# Patient Record
Sex: Male | Born: 1970 | Race: Asian | Hispanic: No | Marital: Married | Smoking: Never smoker
Health system: Southern US, Community
[De-identification: ages and names within clinical notes are randomized; demographics above are authoritative.]

## PROBLEM LIST (undated history)

## (undated) DIAGNOSIS — R040 Epistaxis: Secondary | ICD-10-CM

## (undated) DIAGNOSIS — J329 Chronic sinusitis, unspecified: Secondary | ICD-10-CM

## (undated) DIAGNOSIS — Z91018 Allergy to other foods: Secondary | ICD-10-CM

## (undated) DIAGNOSIS — J302 Other seasonal allergic rhinitis: Secondary | ICD-10-CM

## (undated) DIAGNOSIS — S060XAA Concussion with loss of consciousness status unknown, initial encounter: Secondary | ICD-10-CM

## (undated) DIAGNOSIS — E785 Hyperlipidemia, unspecified: Secondary | ICD-10-CM

## (undated) DIAGNOSIS — Z86010 Personal history of colonic polyps: Secondary | ICD-10-CM

## (undated) DIAGNOSIS — D1803 Hemangioma of intra-abdominal structures: Secondary | ICD-10-CM

## (undated) DIAGNOSIS — S060X9A Concussion with loss of consciousness of unspecified duration, initial encounter: Secondary | ICD-10-CM

## (undated) DIAGNOSIS — R51 Headache: Secondary | ICD-10-CM

## (undated) DIAGNOSIS — K219 Gastro-esophageal reflux disease without esophagitis: Secondary | ICD-10-CM

## (undated) DIAGNOSIS — L509 Urticaria, unspecified: Secondary | ICD-10-CM

## (undated) HISTORY — DX: Chronic sinusitis, unspecified: J32.9

## (undated) HISTORY — DX: Epistaxis: R04.0

## (undated) HISTORY — DX: Urticaria, unspecified: L50.9

## (undated) HISTORY — DX: Hyperlipidemia, unspecified: E78.5

## (undated) HISTORY — DX: Other seasonal allergic rhinitis: J30.2

## (undated) HISTORY — DX: Concussion with loss of consciousness of unspecified duration, initial encounter: S06.0X9A

## (undated) HISTORY — DX: Gastro-esophageal reflux disease without esophagitis: K21.9

## (undated) HISTORY — DX: Headache: R51

## (undated) HISTORY — DX: Concussion with loss of consciousness status unknown, initial encounter: S06.0XAA

---
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
## 1992-10-18 HISTORY — PX: NASAL SINUS SURGERY: SHX719

## 2006-03-01 ENCOUNTER — Ambulatory Visit: Payer: Self-pay | Admitting: Internal Medicine

## 2006-03-10 ENCOUNTER — Ambulatory Visit: Payer: Self-pay | Admitting: Internal Medicine

## 2006-03-16 ENCOUNTER — Ambulatory Visit: Payer: Self-pay | Admitting: Internal Medicine

## 2006-07-10 ENCOUNTER — Emergency Department: Payer: Self-pay | Admitting: Emergency Medicine

## 2006-07-10 ENCOUNTER — Other Ambulatory Visit: Payer: Self-pay

## 2008-02-02 ENCOUNTER — Ambulatory Visit: Payer: Self-pay | Admitting: Internal Medicine

## 2008-02-02 LAB — CONVERTED CEMR LAB
Basophils Absolute: 0 10*3/uL (ref 0.0–0.1)
Bilirubin Urine: NEGATIVE
Bilirubin, Direct: 0.1 mg/dL (ref 0.0–0.3)
Calcium: 9.1 mg/dL (ref 8.4–10.5)
Creatinine, Ser: 0.8 mg/dL (ref 0.4–1.5)
Eosinophils Absolute: 0.1 10*3/uL (ref 0.0–0.7)
GFR calc Af Amer: 140 mL/min
GFR calc non Af Amer: 116 mL/min
HDL: 37.4 mg/dL — ABNORMAL LOW (ref >39.0)
Hemoglobin: 15.7 g/dL (ref 13.0–17.0)
Ketones, ur: NEGATIVE mg/dL
LDL Cholesterol: 120 mg/dL — ABNORMAL HIGH (ref 0–99)
Leukocytes, UA: NEGATIVE
Lymphocytes Relative: 33.8 % (ref 12.0–46.0)
MCHC: 35.2 g/dL (ref 30.0–36.0)
MCV: 86.3 fL (ref 78.0–100.0)
Monocytes Absolute: 0.4 10*3/uL (ref 0.1–1.0)
Neutro Abs: 2.9 10*3/uL (ref 1.4–7.7)
RDW: 13.7 % (ref 11.5–14.6)
Sodium: 142 meq/L (ref 135–145)
TSH: 0.79 microintl units/mL (ref 0.35–5.50)
Total Bilirubin: 0.9 mg/dL (ref 0.3–1.2)
Total CHOL/HDL Ratio: 4.8
Triglycerides: 110 mg/dL (ref 0–149)
Urine Glucose: NEGATIVE mg/dL
Urobilinogen, UA: 0.2 (ref 0.0–1.0)
pH: 5.5 (ref 5.0–8.0)

## 2008-02-12 ENCOUNTER — Ambulatory Visit: Payer: Self-pay | Admitting: Internal Medicine

## 2008-02-12 DIAGNOSIS — J309 Allergic rhinitis, unspecified: Secondary | ICD-10-CM | POA: Insufficient documentation

## 2009-04-21 ENCOUNTER — Emergency Department: Payer: Self-pay

## 2009-04-22 ENCOUNTER — Ambulatory Visit: Payer: Self-pay | Admitting: Internal Medicine

## 2009-04-22 DIAGNOSIS — H6690 Otitis media, unspecified, unspecified ear: Secondary | ICD-10-CM | POA: Insufficient documentation

## 2009-04-22 DIAGNOSIS — R51 Headache: Secondary | ICD-10-CM

## 2009-04-22 DIAGNOSIS — R519 Headache, unspecified: Secondary | ICD-10-CM | POA: Insufficient documentation

## 2010-06-12 ENCOUNTER — Encounter (INDEPENDENT_AMBULATORY_CARE_PROVIDER_SITE_OTHER): Payer: Self-pay | Admitting: *Deleted

## 2010-06-12 ENCOUNTER — Ambulatory Visit: Payer: Self-pay | Admitting: Internal Medicine

## 2010-06-12 DIAGNOSIS — R040 Epistaxis: Secondary | ICD-10-CM

## 2010-06-12 LAB — CONVERTED CEMR LAB
Basophils Relative: 0.3 % (ref 0.0–3.0)
Eosinophils Relative: 1.6 % (ref 0.0–5.0)
HCT: 45.4 % (ref 39.0–52.0)
Lymphs Abs: 2.3 10*3/uL (ref 0.7–4.0)
MCV: 87.6 fL (ref 78.0–100.0)
Monocytes Absolute: 0.5 10*3/uL (ref 0.1–1.0)
Monocytes Relative: 8.1 % (ref 3.0–12.0)
RBC: 5.19 M/uL (ref 4.22–5.81)
WBC: 6.3 10*3/uL (ref 4.5–10.5)

## 2010-06-25 ENCOUNTER — Encounter: Payer: Self-pay | Admitting: Internal Medicine

## 2010-07-10 ENCOUNTER — Ambulatory Visit: Payer: Self-pay | Admitting: Internal Medicine

## 2010-07-10 ENCOUNTER — Encounter: Payer: Self-pay | Admitting: Internal Medicine

## 2010-07-17 ENCOUNTER — Ambulatory Visit: Payer: Self-pay | Admitting: Internal Medicine

## 2010-07-20 LAB — CONVERTED CEMR LAB
BUN: 18 mg/dL (ref 6–23)
Chloride: 103 meq/L (ref 96–112)
Cholesterol: 200 mg/dL (ref 0–200)
Creatinine, Ser: 0.8 mg/dL (ref 0.4–1.5)
GFR calc non Af Amer: 107.7 mL/min (ref >60)
LDL Cholesterol: 139 mg/dL — ABNORMAL HIGH (ref 0–99)
Potassium: 4.1 meq/L (ref 3.5–5.1)
Total CHOL/HDL Ratio: 6
Triglycerides: 129 mg/dL (ref 0.0–149.0)
VLDL: 25.8 mg/dL (ref 0.0–40.0)

## 2010-07-31 ENCOUNTER — Ambulatory Visit: Payer: Self-pay | Admitting: Internal Medicine

## 2010-08-18 ENCOUNTER — Encounter (INDEPENDENT_AMBULATORY_CARE_PROVIDER_SITE_OTHER): Payer: Self-pay | Admitting: *Deleted

## 2010-11-17 NOTE — Letter (Signed)
Summary: [facility]  [facility]   Imported By: Phillis Knack 07/14/2010 14:45:00  _____________________________________________________________________  External Attachment:    Type:   Image     Comment:   External Document

## 2010-11-17 NOTE — Letter (Signed)
Summary: Out of Work  [employer] [place]   [place], [place] [address]   Phone: [phone]  Fax: [phone]    June 12, 2010   Employee:  RALIEGH SCOBIE    To Whom It May Concern:   For Medical reasons, please excuse the above named employee from work for the following dates:  Start: 06/11/2010    End:   06/12/2010    If you need additional information, please feel free to contact our office.         Sincerely,    Dr. Cathlean Cower

## 2010-11-17 NOTE — Letter (Signed)
Summary: Out of Work  [employer] [facility]  [address] [place], [place] [address]   Phone: [phone]  Fax: [phone]    August 18, 2010   Employee:  Douglas [name]    To Whom It May Concern:   For Medical reasons, please excuse the above named employee from work for the following dates:  Start:   September 30,2011  End:   September 30,2011  If you need additional information, please feel free to contact our office.         Sincerely,     Maudie Mercury Dance CMA ([place])

## 2010-11-17 NOTE — Letter (Signed)
Summary: Out of Work  [employer] [facility]  [address] [place], [place] [address]   Phone: [phone]  Fax: [phone]    August 18, 2010   Employee:  Douglas [name]    To Whom It May Concern:   For Medical reasons, please excuse the above named employee from work for the following dates:  Start:   July 10, 2010  End:   July 10, 2010  If you need additional information, please feel free to contact our office.         Sincerely,     Maudie Mercury Dance CMA ([place])

## 2010-11-17 NOTE — Consult Note (Signed)
Summary: [facility]  [facility]   Imported By: Bubba Hales 07/02/2010 10:25:14  _____________________________________________________________________  External Attachment:    Type:   Image     Comment:   External Document

## 2010-11-17 NOTE — Assessment & Plan Note (Signed)
Summary: NOSE BLEEDS/NWS  #   Vital Signs:  Patient profile:   40 year old Douglas [name] Height:      63 inches Weight:      125 pounds BMI:     22.22 O2 Sat:      97 % on Room air Temp:     97.5 degrees F oral Pulse rate:   51 / minute BP sitting:   100 / 62  (left arm) Cuff size:   regular  Vitals Entered By: Shirlean Mylar Ewing CMA (AAMA) (June 12, 2010 10:07 AM)  O2 Flow:  Room air CC: Nose bleeds for 2 weeks/Re   CC:  Nose bleeds for 2 weeks/Re.  History of Present Illness: here with 3 wks recurrent nosebleeds, large amount per wife an pt, may have started with scratching the nose as seemed painful to start, and bleeding several times intermitteint right side nose only;  first time occurred with blood to back of throat, next few times with blood from nose coming out;  no headache, sinus pain, fever, ST, cough adn Pt denies CP, worsening sob, doe, wheezing, orthopnea, pnd, worsening LE edema, palps, dizziness or syncope   Pt denies new neuro symptoms such as headache, facial or extremity weakness  No other overt bleeding or bruising.  Each bleeding episodes seemed to last about Douglas minutes.  Worried about anemia but no orthostasis or fatigue.  No prior hx of same.  No truama, and has not been seen per ER or urgent care.  Does have mils sinus allergies ongoing for several months not well or ? too well treated with the claritin OTC as it can be drying.  Has not tried afrin.  No prior tx with flonase , mucinex or similar or sudafed.   Problems Prior to Update: 1)  Nosebleed  (ICD-784.7) 2)  Headache  (ICD-784.0) 3)  Otitis Media, Acute  (ICD-382.9) 4)  Preventive Health Care  (ICD-V70.0) 5)  Allergic Rhinitis  (ICD-477.9)  Medications Prior to Update: 1)  Claritin 10 Mg  Tabs (Loratadine) .Marland Kitchen.. 1 By Mouth Prn 2)  Cephalexin 500 Mg Caps (Cephalexin) .Marland Kitchen.. 1 By Mouth Three Times A Day  Current Medications (verified): 1)  Singulair 10 Mg Tabs (Montelukast Sodium) .Marland Kitchen.. 1po Once Daily  Allergies  (verified): No Known Drug Allergies  Past History:  Past Medical History: Last updated: 02/12/2008 Allergic rhinitis chronic sinusitis  Past Surgical History: Last updated: 02/12/2008 sinus surgery  1994  Social History: Last updated: 02/12/2008 Married no children Never Smoked Alcohol use-no Visual merchandiser - english and math  Risk Factors: Smoking Status: never (02/12/2008)  Review of Systems       all otherwise negative per pt -    Physical Exam  General:  alert and well-developed.   Head:  normocephalic and atraumatic.   Eyes:  vision grossly intact, pupils equal, and pupils round.   Ears:  R ear normal and L ear normal.   Nose:  no external deformity, nasal dischargemucosal pallor, and mucosal edema.   Mouth:  pharyngeal erythema and fair dentition.   Neck:  supple and no masses.   Lungs:  no intercostal retractions and normal breath sounds.   Heart:  normal rate and regular rhythm.   Extremities:  no edema, no erythema  Neurologic:  cranial nerves grossly II-XII intact, and strength normal in all extremities.     Impression & Recommendations:  Problem # 1:  NOSEBLEED (ICD-784.7) recurrent right nares, prob post aspect bleed;  not bleeding now, to  check cbc, to refer ent for further eval and tx Orders: ENT Referral (ENT) TLB-CBC Platelet - w/Differential ([id number])  Problem # 2:  ALLERGIC RHINITIS (ICD-477.9)  change to singulair as claritin may be too drying, and other antihist likely to do the same, and avoid flonase and similar due to possible worsening noebleed side effect  Problem # 3:  HEADACHE (ICD-784.0) improved from previuos, ok to follow for now  Complete Medication List: 1)  Singulair 10 Mg Tabs (Montelukast sodium) .Marland Kitchen.. 1po once daily  Patient Instructions: 1)  Please take all new medications as prescribed 2)  Continue all previous medications as before this visit  3)  Please go to the Lab in the basement for your blood and/or urine  tests today 4)  Please call the number on the [facility] for results of your testing 5)  You will be contacted about the referral(s) to: ENT 6)  Please schedule a follow-up appointment in 6 months with CPX labs Prescriptions: SINGULAIR 10 MG TABS (MONTELUKAST SODIUM) 1po once daily  #30 x 5   Entered and Authorized by:   Biagio Borg MD   Signed by:   Biagio Borg MD on 06/12/2010   Method used:   Print then Give to Patient   RxID:   [id number]

## 2010-11-17 NOTE — Assessment & Plan Note (Signed)
Summary: PT TRANSFER FROM DR JOHN/CPX/CLE   Vital Signs:  Patient profile:   40 year old male Weight:      125.75 pounds Temp:     98.4 degrees F oral Pulse rate:   64 / minute Pulse rhythm:   regular BP sitting:   124 / 76  (left arm) Cuff size:   regular  Vitals Entered By: Maudie Mercury Dance CMA Deborra Medina) (July 10, 2010 10:24 AM) CC: Transfer from Dr. Jenny Reichmann   History of Present Illness: CC: transfer from Dr. Jenny Reichmann requests CPE.  1. ?allergies - during fall feels lightheaded (no dizziness, vertigo).  Also with slight tenderness behind eyes.  only happens during fall.  Takes claritin for this which helps.  Had nosebleed last week.  thinks nasal saline more drying.  To see ENT in 2 wks for f/u after cauterization.  thinks claritin causes eye dryness, but improves nasal rhinorrhea/congestion.    2. CPE today.  unsure of last tetanus shot but pretty sure > 10 years.  no early fmhx CAD, prostate CA, colon CA.  last FLP with low HDL o/w normal, glu normal (2009).  Current Medications (verified): 1)  Singulair 10 Mg Tabs (Montelukast Sodium) .Marland Kitchen.. 1po Once Daily  Allergies (verified): No Known Drug Allergies  Past History:  Past medical, surgical, family and social histories (including risk factors) reviewed for relevance to current acute and chronic problems.  Past Medical History: Reviewed history from 02/12/2008 and no changes required. Allergic rhinitis chronic sinusitis  Past Surgical History: Reviewed history from 02/12/2008 and no changes required. sinus surgery  1994  Family History: Reviewed history from 02/12/2008 and no changes required. mother D with ESRD/dialysis father D with leukemia  No CA, CVA, CAD/MI, DM, HTN  Social History: Reviewed history from 02/12/2008 and no changes required. Married 41yo adopted Never Smoked Alcohol use-no Visual merchandiser - english and math  Review of Systems       The patient complains of headaches.  The patient denies anorexia,  fever, weight loss, weight gain, vision loss, decreased hearing, hoarseness, chest pain, syncope, dyspnea on exertion, peripheral edema, prolonged cough, abdominal pain, melena, hematochezia, severe indigestion/heartburn, hematuria, genital sores, muscle weakness, suspicious skin lesions, transient blindness, difficulty walking, depression, and testicular masses.         per HPI  Physical Exam  General:  alert and well-developed.   Head:  normocephalic and atraumatic.   Eyes:  vision grossly intact, pupils equal, and pupils round.  early pterygium  Ears:  R ear normal and L ear normal.   Nose:  mucosal pallor, edema. Mouth:  fair dentition, MMM, no pharyngeal erythema/edema Neck:  supple and no masses.   Lungs:  no intercostal retractions and normal breath sounds.   Heart:  normal rate and regular rhythm.   Abdomen:  Bowel sounds positive,abdomen soft and non-tender without masses, organomegaly or hernias noted. Msk:  No deformity or scoliosis noted of thoracic or lumbar spine.   Pulses:  2+ rad pulses Extremities:  no edema Neurologic:  CN grossly intact, station and gait intact Skin:  Intact without suspicious lesions or rashes   Impression & Recommendations:  Problem # 1:  PREVENTIVE HEALTH CARE (ICD-V70.0) Reviewed preventive care protocols, scheduled due services, and updated immunizations.  tdap today.  to receive flu at work.  Problem # 2:  ALLERGIC RHINITIS (ICD-477.9) Discussed use of allergy medications and environmental measures.  pt thinks may have had intolerance to singulair but unsure.  provided with samples today to try.  consider trial of astelin vs other antihistamine if claritin too drying.  Complete Medication List: 1)  Singulair 10 Mg Tabs (Montelukast sodium) .Marland Kitchen.. 1po once daily  Patient Instructions: 1)  Flu shot at work.  Tetanus shot today. 2)  Pleasure to meet you today, call clinic with questions. 3)  Return next week fasting for blood work [FLP, BMP  V70.0] 4)  Try singulair if continue to have HAs - sample provided.  Current Allergies (reviewed today): No known allergies    Prevention & Chronic Care Immunizations   Influenza vaccine: Not documented    Tetanus booster: 07/10/2010: Tdap   Tetanus booster due: 07/10/2020    Pneumococcal vaccine: Not documented  Other Screening   Smoking status: never  (02/12/2008)  Lipids   Total Cholesterol: 179  (02/02/2008)   LDL: 120  (02/02/2008)   LDL Direct: Not documented   HDL: 37.4  (02/02/2008)   Triglycerides: 110  (02/02/2008)   Immunizations Administered:  Tetanus Vaccine:    Vaccine Type: Tdap    Site: left deltoid    Mfr: GlaxoSmithKline    Dose: 0.5 ml    Route: IM    Given by: Maudie Mercury Dance CMA ([place])    Exp. Date: 07/08/2012    Lot #: [id number]    VIS given: 09/04/08 version given July 10, 2010.

## 2011-07-09 ENCOUNTER — Other Ambulatory Visit (INDEPENDENT_AMBULATORY_CARE_PROVIDER_SITE_OTHER): Payer: Self-pay

## 2011-07-09 ENCOUNTER — Other Ambulatory Visit: Payer: Self-pay | Admitting: Family Medicine

## 2011-07-09 ENCOUNTER — Encounter: Payer: Self-pay | Admitting: Family Medicine

## 2011-07-09 DIAGNOSIS — Z0000 Encounter for general adult medical examination without abnormal findings: Secondary | ICD-10-CM

## 2011-07-09 LAB — BASIC METABOLIC PANEL
BUN: 18 mg/dL (ref 6–23)
Chloride: 106 mEq/L (ref 96–112)
Creatinine, Ser: 0.8 mg/dL (ref 0.4–1.5)
Glucose, Bld: 92 mg/dL (ref 70–99)
Potassium: 4.8 mEq/L (ref 3.5–5.1)

## 2011-07-09 LAB — LIPID PANEL
Cholesterol: 195 mg/dL (ref 0–200)
LDL Cholesterol: 136 mg/dL — ABNORMAL HIGH (ref 0–99)
Triglycerides: 98 mg/dL (ref 0.0–149.0)

## 2011-07-12 ENCOUNTER — Ambulatory Visit (INDEPENDENT_AMBULATORY_CARE_PROVIDER_SITE_OTHER): Payer: BC Managed Care – PPO | Admitting: Family Medicine

## 2011-07-12 ENCOUNTER — Encounter: Payer: Self-pay | Admitting: Family Medicine

## 2011-07-12 DIAGNOSIS — Z0000 Encounter for general adult medical examination without abnormal findings: Secondary | ICD-10-CM | POA: Insufficient documentation

## 2011-07-12 DIAGNOSIS — R5381 Other malaise: Secondary | ICD-10-CM | POA: Insufficient documentation

## 2011-07-12 DIAGNOSIS — R5383 Other fatigue: Secondary | ICD-10-CM

## 2011-07-12 NOTE — Assessment & Plan Note (Addendum)
Reviewed preventative protocols and updated unless pt declined. Discussed heatlhy living and diet.

## 2011-07-12 NOTE — Progress Notes (Signed)
Subjective:    Patient ID: Douglas [name], male    DOB: May 17, 1971, 40 y.o.   MRN: [id number]  HPI CC: CPE  Here for CPE today.  Gets flu shot at work.  Not feeling too well - this morning started feeling ill.  Chills/fever, tired - took advil and felt better.  No abd pain, n/v, cough, RN or congestion, ST, HA.  + rash, thinks allergy rxn - present for long time, comes on after eating red meat.  No sick contacts at home.  Last weekend vomited x1, thinks something he ate.  Saturday 2d ago went hunting, didn't notice any tick bites but has had mosquito bites.    Doing well with claritin, singulair otherwise.  Allergies tend to do worse in fall than spring.  Medications and allergies reviewed and updated in chart.  Past histories reviewed and updated if relevant as below. Patient Active Problem List  Diagnoses  . OTITIS MEDIA, ACUTE  . ALLERGIC RHINITIS  . HEADACHE  . NOSEBLEED   Past Medical History  Diagnosis Date  . Allergic rhinitis   . Sinusitis, chronic   . Headache   . Epistaxis    Past Surgical History  Procedure Date  . Nasal sinus surgery 1994   History  Substance Use Topics  . Smoking status: Never Smoker   . Smokeless tobacco: Not on file  . Alcohol Use: No   Family History  Problem Relation Age of Onset  . Kidney disease Mother     ESRD-Dialysis  . Leukemia Father    No Known Allergies Current Outpatient Prescriptions on File Prior to Visit  Medication Sig Dispense Refill  . montelukast (SINGULAIR) 10 MG tablet Take 10 mg by mouth at bedtime.         Review of Systems  Constitutional: Negative for fever, chills, activity change, appetite change, fatigue and unexpected weight change.  HENT: Negative for hearing loss and neck pain.   Eyes: Negative for visual disturbance.  Respiratory: Negative for cough, chest tightness, shortness of breath and wheezing.   Cardiovascular: Negative for chest pain, palpitations and leg swelling.  Gastrointestinal: Negative  for nausea, vomiting, abdominal pain, diarrhea, constipation, blood in stool and abdominal distention.  Genitourinary: Negative for hematuria and difficulty urinating.  Musculoskeletal: Negative for myalgias and arthralgias.  Skin: Negative for rash.  Neurological: Negative for dizziness, seizures, syncope and headaches.  Hematological: Does not bruise/bleed easily.  Psychiatric/Behavioral: Negative for dysphoric mood. The patient is not nervous/anxious.        Objective:   Physical Exam  Nursing note and vitals reviewed. Constitutional: He is oriented to person, place, and time. He appears well-developed and well-nourished. No distress.  HENT:  Head: Normocephalic and atraumatic.  Right Ear: External ear normal.  Left Ear: External ear normal.  Nose: Nose normal.  Mouth/Throat: Oropharynx is clear and moist. No oropharyngeal exudate.  Eyes: Conjunctivae and EOM are normal. Pupils are equal, round, and reactive to light.  Neck: Normal range of motion. Neck supple. No thyromegaly present.  Cardiovascular: Normal rate, regular rhythm, normal heart sounds and intact distal pulses.   No murmur heard. Pulses:      Radial pulses are 2+ on the right side, and 2+ on the left side.  Pulmonary/Chest: Effort normal and breath sounds normal. No respiratory distress. He has no wheezes. He has no rales.  Abdominal: Soft. Bowel sounds are normal. He exhibits no distension and no mass. There is no tenderness. There is no rebound and no guarding.  Musculoskeletal: Normal range of motion. He exhibits no edema.  Lymphadenopathy:    He has no cervical adenopathy.       Right: No supraclavicular adenopathy present.       Left: No supraclavicular adenopathy present.  Neurological: He is alert and oriented to person, place, and time.       CN grossly intact, station and gait intact  Skin: Skin is warm and dry. No rash noted.  Psychiatric: He has a normal mood and affect. His behavior is normal. Judgment  and thought content normal.      Assessment & Plan:

## 2011-07-12 NOTE — Assessment & Plan Note (Signed)
Nonspecific sxs, 1d duration, recent blood work last week ok (BMP), exam today benign. Possible viral syndrome. Advised notify us if not improving as expected, would likely obtain more blood work ,starting with CBC.

## 2011-07-12 NOTE — Patient Instructions (Signed)
This could be viral infection. Let me know if fever >101.5, bad headache or abdominal pain or confusion, we may want to see you again. If not improving as expected, let us know. Otherwise things are looking good today.  Return in 1-2 years for next physical.

## 2011-07-15 ENCOUNTER — Telehealth: Payer: Self-pay | Admitting: *Deleted

## 2011-07-15 NOTE — Telephone Encounter (Signed)
Filled and placed in my outbox.

## 2011-07-15 NOTE — Telephone Encounter (Signed)
Pt has dropped off a form for her insurance.  This just needs your signature, in your in box.

## 2011-07-16 NOTE — Telephone Encounter (Signed)
OUT box handled by front office.

## 2011-09-17 LAB — COMPREHENSIVE METABOLIC PANEL
Calcium: 9 mg/dL
Chloride: 102 mmol/L
Creat: 0.9
Potassium: 3.6 mmol/L
Sodium: 140 mmol/L (ref 137–147)

## 2011-09-17 LAB — LIPID PANEL: HDL: 39 mg/dL (ref 35–70)

## 2011-09-29 ENCOUNTER — Encounter: Payer: Self-pay | Admitting: Family Medicine

## 2012-07-02 ENCOUNTER — Other Ambulatory Visit: Payer: Self-pay | Admitting: Family Medicine

## 2012-07-02 DIAGNOSIS — Z0000 Encounter for general adult medical examination without abnormal findings: Secondary | ICD-10-CM

## 2012-07-06 ENCOUNTER — Other Ambulatory Visit (INDEPENDENT_AMBULATORY_CARE_PROVIDER_SITE_OTHER): Payer: BC Managed Care – PPO

## 2012-07-06 DIAGNOSIS — Z0000 Encounter for general adult medical examination without abnormal findings: Secondary | ICD-10-CM

## 2012-07-06 LAB — BASIC METABOLIC PANEL
BUN: 22 mg/dL (ref 6–23)
CO2: 28 mEq/L (ref 19–32)
Chloride: 105 mEq/L (ref 96–112)
Glucose, Bld: 94 mg/dL (ref 70–99)
Potassium: 3.9 mEq/L (ref 3.5–5.1)

## 2012-07-06 LAB — LIPID PANEL
LDL Cholesterol: 118 mg/dL — ABNORMAL HIGH (ref 0–99)
VLDL: 30.4 mg/dL (ref 0.0–40.0)

## 2012-07-13 ENCOUNTER — Encounter: Payer: Self-pay | Admitting: Family Medicine

## 2012-07-13 ENCOUNTER — Ambulatory Visit (INDEPENDENT_AMBULATORY_CARE_PROVIDER_SITE_OTHER): Payer: BC Managed Care – PPO | Admitting: Family Medicine

## 2012-07-13 VITALS — BP 112/70 | HR 59 | Temp 98.2°F | Wt 123.0 lb

## 2012-07-13 DIAGNOSIS — Z0000 Encounter for general adult medical examination without abnormal findings: Secondary | ICD-10-CM

## 2012-07-13 DIAGNOSIS — E785 Hyperlipidemia, unspecified: Secondary | ICD-10-CM

## 2012-07-13 NOTE — Progress Notes (Signed)
Subjective:    Patient ID: Douglas [name], male    DOB: 1970/11/16, 41 y.o.   MRN: [id number]  HPI CC: CPE  Preventative: Flu shot at work. Td - 2011  Caffeine: 1 cup coffee/day Married, lives with wife and son (adopted), no pets Occupation: Social research officer, government Activity: likes to play tennis with son and wife, walking at home Diet: fruits and vegetables daily, good water, red meat 1x/wk, fish 1x/wk  Sunscreen use discussed. Seatbelt use discussed.  Medications and allergies reviewed and updated in chart.  Past histories reviewed and updated if relevant as below. Patient Active Problem List  Diagnosis  . ALLERGIC RHINITIS  . HEADACHE  . Healthcare maintenance  . Malaise   Past Medical History  Diagnosis Date  . Seasonal allergic rhinitis     worse in spring and fall  . Sinusitis, chronic   . Headache   . Epistaxis    Past Surgical History  Procedure Date  . Nasal sinus surgery 1994   History  Substance Use Topics  . Smoking status: Never Smoker   . Smokeless tobacco: Never Used  . Alcohol Use: No   Family History  Problem Relation Age of Onset  . Kidney disease Mother     ESRD-Dialysis from too much tylenol  . Leukemia Father 60  . Diabetes Neg Hx   . Coronary artery disease Neg Hx   . Stroke Neg Hx    No Known Allergies Current Outpatient Prescriptions on File Prior to Visit  Medication Sig Dispense Refill  . loratadine (CLARITIN) 10 MG tablet Take 10 mg by mouth daily.          Review of Systems  Constitutional: Negative for fever, chills, activity change, appetite change, fatigue and unexpected weight change.  HENT: Negative for hearing loss and neck pain.   Eyes: Negative for visual disturbance.  Respiratory: Negative for cough, chest tightness, shortness of breath and wheezing.   Cardiovascular: Negative for chest pain, palpitations and leg swelling.  Gastrointestinal: Negative for nausea, vomiting, abdominal pain, diarrhea, constipation, blood in stool and  abdominal distention.  Genitourinary: Negative for hematuria and difficulty urinating.  Musculoskeletal: Negative for myalgias and arthralgias.  Skin: Negative for rash.  Neurological: Negative for dizziness, seizures, syncope and headaches.  Hematological: Does not bruise/bleed easily.  Psychiatric/Behavioral: Negative for dysphoric mood. The patient is not nervous/anxious.        Objective:   Physical Exam  Nursing note and vitals reviewed. Constitutional: He is oriented to person, place, and time. He appears well-developed and well-nourished. No distress.  HENT:  Head: Normocephalic and atraumatic.  Right Ear: Hearing, tympanic membrane, external ear and ear canal normal.  Left Ear: Hearing, tympanic membrane, external ear and ear canal normal.  Nose: Nose normal.  Mouth/Throat: Oropharynx is clear and moist. No oropharyngeal exudate.  Eyes: Conjunctivae normal and EOM are normal. Pupils are equal, round, and reactive to light. No scleral icterus.  Neck: Normal range of motion. Neck supple.  Cardiovascular: Normal rate, regular rhythm, normal heart sounds and intact distal pulses.   No murmur heard. Pulses:      Radial pulses are 2+ on the right side, and 2+ on the left side.  Pulmonary/Chest: Effort normal and breath sounds normal. No respiratory distress. He has no wheezes. He has no rales.  Abdominal: Soft. Bowel sounds are normal. He exhibits no distension and no mass. There is no tenderness. There is no rebound and no guarding.  Musculoskeletal: Normal range of motion. He exhibits no  edema.  Lymphadenopathy:    He has no cervical adenopathy.  Neurological: He is alert and oriented to person, place, and time.       CN grossly intact, station and gait intact  Skin: Skin is warm and dry. No rash noted.  Psychiatric: He has a normal mood and affect. His behavior is normal. Judgment and thought content normal.       Assessment & Plan:

## 2012-07-13 NOTE — Patient Instructions (Signed)
Good to see you today, call us with questions. Return in 1-2 years for next physical. Work on more aerobic exercise.

## 2012-07-13 NOTE — Assessment & Plan Note (Signed)
Preventative protocols reviewed and updated unless pt declined. Discussed healthy diet and lifestyle.  Encouraged increased aerobic exercise to improve HDL

## 2012-07-13 NOTE — Assessment & Plan Note (Signed)
Reviewed #s with patient.

## 2013-11-14 ENCOUNTER — Ambulatory Visit (INDEPENDENT_AMBULATORY_CARE_PROVIDER_SITE_OTHER): Payer: BC Managed Care – PPO | Admitting: Internal Medicine

## 2013-11-14 ENCOUNTER — Encounter: Payer: Self-pay | Admitting: Internal Medicine

## 2013-11-14 VITALS — BP 102/70 | HR 64 | Temp 98.2°F | Wt 126.5 lb

## 2013-11-14 DIAGNOSIS — R109 Unspecified abdominal pain: Secondary | ICD-10-CM

## 2013-11-14 DIAGNOSIS — B349 Viral infection, unspecified: Secondary | ICD-10-CM

## 2013-11-14 DIAGNOSIS — B9789 Other viral agents as the cause of diseases classified elsewhere: Secondary | ICD-10-CM

## 2013-11-14 DIAGNOSIS — R112 Nausea with vomiting, unspecified: Secondary | ICD-10-CM

## 2013-11-14 DIAGNOSIS — Z20828 Contact with and (suspected) exposure to other viral communicable diseases: Secondary | ICD-10-CM

## 2013-11-14 DIAGNOSIS — R509 Fever, unspecified: Secondary | ICD-10-CM

## 2013-11-14 LAB — POCT INFLUENZA A/B
Influenza A, POC: NEGATIVE
Influenza B, POC: NEGATIVE

## 2013-11-14 MED ORDER — ONDANSETRON HCL 4 MG PO TABS: 4.0000 mg | ORAL_TABLET | Freq: Three times a day (TID) | ORAL | Status: DC | PRN

## 2013-11-14 NOTE — Progress Notes (Signed)
HPI: Pt presents today with concerns about fatigue, emesis, and fever. Pt states he started feeling bad about two days ago with fever, chills, fatigue, emesis (worse in am), and abdominal cramping. Pt states he feels worse in am and returns in the evening. He denies cough, chest congestion, sore throat, nasal discharge, or shortness of breath. He endorses being around sick contacts. He has tried OTC Ibuprofen for fever.   Past Medical History  Diagnosis Date  . Seasonal allergic rhinitis     worse in spring and fall  . Sinusitis, chronic   . Headache(784.0)   . Epistaxis   . Dyslipidemia     Current Outpatient Prescriptions  Medication Sig Dispense Refill  . loratadine (CLARITIN) 10 MG tablet Take 10 mg by mouth daily.         No current facility-administered medications for this visit.    No Known Allergies  Family History  Problem Relation Age of Onset  . Kidney disease Mother     ESRD-Dialysis from too much tylenol  . Leukemia Father 51  . Diabetes Neg Hx   . Coronary artery disease Neg Hx   . Stroke Neg Hx     History   Social History  . Marital Status: Married    Spouse Name: N/A    Number of Children: 1  . Years of Education: N/A   Occupational History  . Student    Social History Main Topics  . Smoking status: Never Smoker   . Smokeless tobacco: Never Used  . Alcohol Use: No  . Drug Use: No  . Sexual Activity: Not on file   Other Topics Concern  . Not on file   Social History Narrative   Caffeine: 1 cup coffee/day   Married, lives with wife and son (adopted), no pets   Occupation: Social research officer, government   Activity: likes to play tennis with son and wife, walking at home   Diet: fruits and vegetables daily, good water, red meat 1x/wk, fish 1x/wk    ROS:  Constitutional:Endorses fever, chills, body aches.  Denies f, headache or abrupt weight changes.  HEENT:Endorses nasal discharge.  Denies eye pain, eye redness, ear pain, ringing in the ears, wax buildup,  nasal congestion, bloody nose, or sore throat. Respiratory: Denies difficulty breathing, shortness of breath, cough or sputum production.   Cardiovascular: Denies chest pain, chest tightness, palpitations or swelling in the hands or feet.  Gastrointestinal:Endorses abdominal cramping, nausea, emesis.  Denies abdominal pain, bloating, constipation, diarrhea or blood in the stool.   Neurological:Endorses dizziness. Denies difficulty with memory, difficulty with speech or problems with balance and coordination.   No other specific complaints in a complete review of systems (except as listed in HPI above).  PE:  BP 102/70  Pulse 64  Temp(Src) 98.2 F (36.8 C) (Oral)  Wt 126 lb 8 oz (57.38 kg)  SpO2 98% Wt Readings from Last 3 Encounters:  11/14/13 126 lb 8 oz (57.38 kg)  07/13/12 123 lb (55.792 kg)  07/12/11 126 lb 12 oz (57.493 kg)    General: Appears their stated age, well developed, well nourished in NAD. HEENT: Head: normal shape and size; Eyes: sclera white, no icterus, conjunctiva pink, PERRLA and EOMs intact; Ears: Tm's gray and intact, normal light reflex; Nose: mucosa pink and moist, septum midline; Throat/Mouth: Teeth present, mucosa pink and moist, no lesions or ulcerations noted.  Neck: Normal range of motion. Neck supple, trachea midline. No massses, lumps or thyromegaly present.  Cardiovascular: Normal rate and  rhythm. S1,S2 noted.  No murmur, rubs or gallops noted. No JVD or BLE edema. No carotid bruits noted. Pulmonary/Chest: Normal effort and positive vesicular breath sounds. No respiratory distress. No wheezes, rales or ronchi noted.  Abdomen: Soft and nontender. Hyperactive bowel sounds, no bruits noted.Non tender to palpation. Dull to resonant to percussion. No distention or masses noted. Liver, spleen and kidneys non palpable. Psychiatric: Mood and affect normal. Behavior is normal. Judgment and thought content normal.   Assessment and Plan: Gastritis,  viral Supportive care rest and fluids Rx sent for Zofran 40m po every 6 hrs as needed for nausea Follow up if in 3-5 days if symptoms worsen or do not improve  Moriah Loughry S, Student-NP

## 2013-11-14 NOTE — Progress Notes (Signed)
Pre-visit discussion using our clinic review tool. No additional management support is needed unless otherwise documented below in the visit note.  

## 2013-11-14 NOTE — Progress Notes (Signed)
Subjective:    Patient ID: Douglas [name], male    DOB: 1971/10/09, 43 y.o.   MRN: [id number]  HPI  Pt presents to the clinic today with c/o abdominal cramping, vomiting and fever. He reports this started 2 days ago. He has had associated chills, nausea. He has not been having any diarrhea. His symptoms seems to be worse in the morning and late in the evening. He has tried ibuprofen OTC. He has had sick contacts.  Review of Systems      Past Medical History  Diagnosis Date  . Seasonal allergic rhinitis     worse in spring and fall  . Sinusitis, chronic   . Headache(784.0)   . Epistaxis   . Dyslipidemia     Current Outpatient Prescriptions  Medication Sig Dispense Refill  . loratadine (CLARITIN) 10 MG tablet Take 10 mg by mouth daily.         No current facility-administered medications for this visit.    No Known Allergies  Family History  Problem Relation Age of Onset  . Kidney disease Mother     ESRD-Dialysis from too much tylenol  . Leukemia Father 83  . Diabetes Neg Hx   . Coronary artery disease Neg Hx   . Stroke Neg Hx     History   Social History  . Marital Status: Married    Spouse Name: N/A    Number of Children: 1  . Years of Education: N/A   Occupational History  . Student    Social History Main Topics  . Smoking status: Never Smoker   . Smokeless tobacco: Never Used  . Alcohol Use: No  . Drug Use: No  . Sexual Activity: Not on file   Other Topics Concern  . Not on file   Social History Narrative   Caffeine: 1 cup coffee/day   Married, lives with wife and son (adopted), no pets   Occupation: Social research officer, government   Activity: likes to play tennis with son and wife, walking at home   Diet: fruits and vegetables daily, good water, red meat 1x/wk, fish 1x/wk     Constitutional: Denies fever, malaise, fatigue, headache or abrupt weight changes.   Gastrointestinal: Pt reports abdominal pain, vomiting. Denies bloating, constipation, diarrhea or blood in  the stool.    No other specific complaints in a complete review of systems (except as listed in HPI above).   Objective:   Physical Exam   BP 102/70  Pulse 64  Temp(Src) 98.2 F (36.8 C) (Oral)  Wt 126 lb 8 oz (57.38 kg)  SpO2 98% Wt Readings from Last 3 Encounters:  11/14/13 126 lb 8 oz (57.38 kg)  07/13/12 123 lb (55.792 kg)  07/12/11 126 lb 12 oz (57.493 kg)    General: Appears his stated age, ill appearing in NAD. Cardiovascular: Normal rate and rhythm. S1,S2 noted.  No murmur, rubs or gallops noted. No JVD or BLE edema. No carotid bruits noted. Pulmonary/Chest: Normal effort and positive vesicular breath sounds. No respiratory distress. No wheezes, rales or ronchi noted.  Abdomen: Soft and generally tender. Hyperactive bowel sounds, no bruits noted. No distention or masses noted. Liver, spleen and kidneys non palpable.   BMET    Component Value Date/Time   NA 139 07/06/2012 0845   NA 140 09/17/2011   K 3.9 07/06/2012 0845   K 3.6 09/17/2011   CL 105 07/06/2012 0845   CL 102 09/17/2011   CO2 28 07/06/2012 0845   GLUCOSE 94 07/06/2012  0845   BUN 22 07/06/2012 0845   BUN 15 09/17/2011   CREATININE 0.8 07/06/2012 0845   CREATININE 0.90 09/17/2011   CALCIUM 8.8 07/06/2012 0845   CALCIUM 9.0 09/17/2011   GFRNONAA 107.70 07/17/2010 0821   GFRAA 140 02/02/2008 1238    Lipid Panel     Component Value Date/Time   CHOL 183 07/06/2012 0845   TRIG 152.0* 07/06/2012 0845   TRIG 185 09/17/2011   HDL 34.50* 07/06/2012 0845   CHOLHDL 5 07/06/2012 0845   VLDL 30.4 07/06/2012 0845   LDLCALC 118* 07/06/2012 0845    CBC    Component Value Date/Time   WBC 6.3 06/12/2010 1046   RBC 5.19 06/12/2010 1046   HGB 15.9 06/12/2010 1046   HCT 45.4 06/12/2010 1046   PLT 182.0 06/12/2010 1046   MCV 87.6 06/12/2010 1046   MCHC 34.9 06/12/2010 1046   RDW 13.8 06/12/2010 1046   LYMPHSABS 2.3 06/12/2010 1046   MONOABS 0.5 06/12/2010 1046   EOSABS 0.1 06/12/2010 1046   BASOSABS 0.0 06/12/2010 1046     Hgb A1C No results found for this basename: HGBA1C        Assessment & Plan:   Viral gastritis:  Supportive care with rest, fluids and ibuprofen eRx for Zofran 4 mg TID prn for nausea Emphasized the importance of hand washing and sanitization of the bath room.  RTC as needed or if symptoms persist or worsen

## 2013-11-14 NOTE — Patient Instructions (Signed)

## 2015-05-26 ENCOUNTER — Emergency Department (INDEPENDENT_AMBULATORY_CARE_PROVIDER_SITE_OTHER)
Admission: EM | Admit: 2015-05-26 | Discharge: 2015-05-26 | Disposition: A | Discharge status: Home / Self Care | Payer: Self-pay | Source: Home / Self Care | Attending: Family Medicine | Admitting: Family Medicine

## 2015-05-26 ENCOUNTER — Encounter (HOSPITAL_COMMUNITY): Payer: Self-pay | Admitting: Emergency Medicine

## 2015-05-26 DIAGNOSIS — S233XXA Sprain of ligaments of thoracic spine, initial encounter: Secondary | ICD-10-CM

## 2015-05-26 DIAGNOSIS — S161XXA Strain of muscle, fascia and tendon at neck level, initial encounter: Secondary | ICD-10-CM

## 2015-05-26 DIAGNOSIS — S29012A Strain of muscle and tendon of back wall of thorax, initial encounter: Secondary | ICD-10-CM
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
## 2015-05-28 ENCOUNTER — Emergency Department (HOSPITAL_COMMUNITY)
Admission: EM | Admit: 2015-05-28 | Discharge: 2015-05-28 | Disposition: A | Discharge status: Home / Self Care | Payer: No Typology Code available for payment source | Attending: Emergency Medicine | Admitting: Emergency Medicine

## 2015-05-28 ENCOUNTER — Encounter (HOSPITAL_COMMUNITY): Payer: Self-pay | Admitting: Emergency Medicine

## 2015-05-28 ENCOUNTER — Emergency Department (HOSPITAL_COMMUNITY): Payer: No Typology Code available for payment source

## 2015-05-28 DIAGNOSIS — Y92410 Unspecified street and highway as the place of occurrence of the external cause: Secondary | ICD-10-CM | POA: Diagnosis not present

## 2015-05-28 DIAGNOSIS — S0990XA Unspecified injury of head, initial encounter: Secondary | ICD-10-CM | POA: Diagnosis not present

## 2015-05-28 DIAGNOSIS — Y9389 Activity, other specified: Secondary | ICD-10-CM | POA: Diagnosis not present

## 2015-05-28 DIAGNOSIS — Y998 Other external cause status: Secondary | ICD-10-CM | POA: Insufficient documentation

## 2015-05-28 DIAGNOSIS — S060X0D Concussion without loss of consciousness, subsequent encounter: Secondary | ICD-10-CM | POA: Insufficient documentation

## 2015-05-28 DIAGNOSIS — Z8719 Personal history of other diseases of the digestive system: Secondary | ICD-10-CM | POA: Insufficient documentation

## 2015-05-28 DIAGNOSIS — Z79899 Other long term (current) drug therapy: Secondary | ICD-10-CM | POA: Diagnosis not present

## 2015-05-28 DIAGNOSIS — S39012A Strain of muscle, fascia and tendon of lower back, initial encounter: Secondary | ICD-10-CM | POA: Diagnosis not present

## 2015-05-28 DIAGNOSIS — Z8639 Personal history of other endocrine, nutritional and metabolic disease: Secondary | ICD-10-CM | POA: Insufficient documentation

## 2015-05-28 DIAGNOSIS — S161XXA Strain of muscle, fascia and tendon at neck level, initial encounter: Secondary | ICD-10-CM | POA: Insufficient documentation

## 2015-05-28 DIAGNOSIS — S199XXA Unspecified injury of neck, initial encounter: Secondary | ICD-10-CM | POA: Diagnosis present

## 2015-05-28 DIAGNOSIS — S299XXA Unspecified injury of thorax, initial encounter: Secondary | ICD-10-CM | POA: Insufficient documentation

## 2015-05-28 MED ORDER — METHOCARBAMOL 500 MG PO TABS: 500.0000 mg | ORAL_TABLET | Freq: Two times a day (BID) | ORAL | Status: DC

## 2015-05-28 MED ORDER — IBUPROFEN 600 MG PO TABS: 600.0000 mg | ORAL_TABLET | Freq: Four times a day (QID) | ORAL | Status: DC | PRN

## 2015-05-28 MED ORDER — TRAMADOL HCL 50 MG PO TABS: 50.0000 mg | ORAL_TABLET | Freq: Four times a day (QID) | ORAL | Status: DC | PRN
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
## 2015-05-28 NOTE — ED Notes (Signed)
Per PA, patient now states that he has dizziness, loss of concentration, unsure if he hit his head.   Patient told me that he did not hit his head and there was no loss of consciousness.
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
## 2015-05-28 NOTE — ED Notes (Signed)
Patient transported to X-ray 

## 2015-05-28 NOTE — Discharge Instructions (Signed)
Take ibuprofen for pain. Take tramadol for severe pain. Take Robaxin for muscle spasms. Your CT and x-rays were all negative. Your pain is most likely muscular and you must follow-up with you primary care doctor if your pain is not improving in 3-5 days.  Cervical Sprain A cervical sprain is an injury in the neck in which the strong, fibrous tissues (ligaments) that connect your neck bones stretch or tear. Cervical sprains can range from mild to severe. Severe cervical sprains can cause the neck vertebrae to be unstable. This can lead to damage of the spinal cord and can result in serious nervous system problems. The amount of time it takes for a cervical sprain to get better depends on the cause and extent of the injury. Most cervical sprains heal in 1 to 3 weeks. CAUSES  Severe cervical sprains may be caused by:   Contact sport injuries (such as from football, rugby, wrestling, hockey, auto racing, gymnastics, diving, martial arts, or boxing).   Motor vehicle collisions.   Whiplash injuries. This is an injury from a sudden forward and backward whipping movement of the head and neck.  Falls.  Mild cervical sprains may be caused by:   Being in an awkward position, such as while cradling a telephone between your ear and shoulder.   Sitting in a chair that does not offer proper support.   Working at a poorly Landscape [employer] station.   Looking up or down for long periods of time.  SYMPTOMS   Pain, soreness, stiffness, or a burning sensation in the front, back, or sides of the neck. This discomfort may develop immediately after the injury or slowly, 24 hours or more after the injury.   Pain or tenderness directly in the middle of the back of the neck.   Shoulder or upper back pain.   Limited ability to move the neck.   Headache.   Dizziness.   Weakness, numbness, or tingling in the hands or arms.   Muscle spasms.   Difficulty swallowing or chewing.    Tenderness and swelling of the neck.  DIAGNOSIS  Most of the time your health care provider can diagnose a cervical sprain by taking your history and doing a physical exam. Your health care provider will ask about previous neck injuries and any known neck problems, such as arthritis in the neck. X-rays may be taken to find out if there are any other problems, such as with the bones of the neck. Other tests, such as a CT scan or MRI, may also be needed.  TREATMENT  Treatment depends on the severity of the cervical sprain. Mild sprains can be treated with rest, keeping the neck in place (immobilization), and pain medicines. Severe cervical sprains are immediately immobilized. Further treatment is done to help with pain, muscle spasms, and other symptoms and may include:  Medicines, such as pain relievers, numbing medicines, or muscle relaxants.   Physical therapy. This may involve stretching exercises, strengthening exercises, and posture training. Exercises and improved posture can help stabilize the neck, strengthen muscles, and help stop symptoms from returning.  HOME CARE INSTRUCTIONS   Put ice on the injured area.   Put ice in a plastic bag.   Place a towel between your skin and the bag.   Leave the ice on for 15-20 minutes, 3-4 times a day.   If your injury was severe, you may have been given a cervical collar to wear. A cervical collar is a two-piece collar designed to keep  your neck from moving while it heals.  Do not remove the collar unless instructed by your health care provider.  If you have long hair, keep it outside of the collar.  Ask your health care provider before making any adjustments to your collar. Minor adjustments may be required over time to improve comfort and reduce pressure on your chin or on the back of your head.  Ifyou are allowed to remove the collar for cleaning or bathing, follow your health care provider's instructions on how to do so  safely.  Keep your collar clean by wiping it with mild soap and water and drying it completely. If the collar you have been given includes removable pads, remove them every 1-2 days and hand wash them with soap and water. Allow them to air dry. They should be completely dry before you wear them in the collar.  If you are allowed to remove the collar for cleaning and bathing, wash and dry the skin of your neck. Check your skin for irritation or sores. If you see any, tell your health care provider.  Do not drive while wearing the collar.   Only take over-the-counter or prescription medicines for pain, discomfort, or fever as directed by your health care provider.   Keep all follow-up appointments as directed by your health care provider.   Keep all physical therapy appointments as directed by your health care provider.   Make any needed adjustments to your workstation to promote good posture.   Avoid positions and activities that make your symptoms worse.   Warm up and stretch before being active to help prevent problems.  SEEK MEDICAL CARE IF:   Your pain is not controlled with medicine.   You are unable to decrease your pain medicine over time as planned.   Your activity level is not improving as expected.  SEEK IMMEDIATE MEDICAL CARE IF:   You develop any bleeding.  You develop stomach upset.  You have signs of an allergic reaction to your medicine.   Your symptoms get worse.   You develop new, unexplained symptoms.   You have numbness, tingling, weakness, or paralysis in any part of your body.  MAKE SURE YOU:   Understand these instructions.  Will watch your condition.  Will get help right away if you are not doing well or get worse. Document Released: 08/01/2007 Document Revised: 10/09/2013 Document Reviewed: 04/11/2013 [facility] Patient Information 2015 [place], [place]. This information is not intended to replace advice given to you by your health care  provider. Make sure you discuss any questions you have with your health care provider.  Lumbosacral Strain Lumbosacral strain is a strain of any of the parts that make up your lumbosacral vertebrae. Your lumbosacral vertebrae are the bones that make up the lower third of your backbone. Your lumbosacral vertebrae are held together by muscles and tough, fibrous tissue (ligaments).  CAUSES  A sudden blow to your back can cause lumbosacral strain. Also, anything that causes an excessive stretch of the muscles in the low back can cause this strain. This is typically seen when people exert themselves strenuously, fall, lift heavy objects, bend, or crouch repeatedly. RISK FACTORS  Physically demanding work.  Participation in pushing or pulling sports or sports that require a sudden twist of the back (tennis, golf, baseball).  Weight lifting.  Excessive lower back curvature.  Forward-tilted pelvis.  Weak back or abdominal muscles or both.  Tight hamstrings. SIGNS AND SYMPTOMS  Lumbosacral strain may cause pain in  the area of your injury or pain that moves (radiates) down your leg.  DIAGNOSIS Your health care provider can often diagnose lumbosacral strain through a physical exam. In some cases, you may need tests such as X-ray exams.  TREATMENT  Treatment for your lower back injury depends on many factors that your clinician will have to evaluate. However, most treatment will include the use of anti-inflammatory medicines. HOME CARE INSTRUCTIONS   Avoid hard physical activities (tennis, racquetball, waterskiing) if you are not in proper physical condition for it. This may aggravate or create problems.  If you have a back problem, avoid sports requiring sudden body movements. Swimming and walking are generally safer activities.  Maintain good posture.  Maintain a healthy weight.  For acute conditions, you may put ice on the injured area.  Put ice in a plastic bag.  Place a towel  between your skin and the bag.  Leave the ice on for 20 minutes, 2-3 times a day.  When the low back starts healing, stretching and strengthening exercises may be recommended. SEEK MEDICAL CARE IF:  Your back pain is getting worse.  You experience severe back pain not relieved with medicines. SEEK IMMEDIATE MEDICAL CARE IF:   You have numbness, tingling, weakness, or problems with the use of your arms or legs.  There is a change in bowel or bladder control.  You have increasing pain in any area of the body, including your belly (abdomen).  You notice shortness of breath, dizziness, or feel faint.  You feel sick to your stomach (nauseous), are throwing up (vomiting), or become sweaty.  You notice discoloration of your toes or legs, or your feet get very cold. MAKE SURE YOU:   Understand these instructions.  Will watch your condition.  Will get help right away if you are not doing well or get worse. Document Released: 07/14/2005 Document Revised: 10/09/2013 Document Reviewed: 05/23/2013 [facility] Patient Information 2015 [place], [place]. This information is not intended to replace advice given to you by your health care provider. Make sure you discuss any questions you have with your health care provider.

## 2019-05-18 ENCOUNTER — Ambulatory Visit: Payer: 59 | Admitting: Family Medicine

## 2019-05-18 ENCOUNTER — Ambulatory Visit (INDEPENDENT_AMBULATORY_CARE_PROVIDER_SITE_OTHER)
Admission: RE | Admit: 2019-05-18 | Discharge: 2019-05-18 | Disposition: A | Discharge status: Home / Self Care | Payer: 59 | Source: Ambulatory Visit | Attending: Family Medicine | Admitting: Family Medicine

## 2019-05-18 ENCOUNTER — Other Ambulatory Visit: Payer: Self-pay

## 2019-05-18 ENCOUNTER — Encounter: Payer: Self-pay | Admitting: Family Medicine

## 2019-05-18 VITALS — BP 118/84 | HR 60 | Temp 97.3°F | Ht 62.5 in | Wt 133.1 lb

## 2019-05-18 DIAGNOSIS — G8929 Other chronic pain: Secondary | ICD-10-CM | POA: Insufficient documentation

## 2019-05-18 DIAGNOSIS — R12 Heartburn: Secondary | ICD-10-CM | POA: Diagnosis not present

## 2019-05-18 DIAGNOSIS — R1032 Left lower quadrant pain: Secondary | ICD-10-CM

## 2019-05-18 LAB — CBC WITH DIFFERENTIAL/PLATELET
Basophils Absolute: 0 10*3/uL (ref 0.0–0.1)
Basophils Relative: 0.3 % (ref 0.0–3.0)
Eosinophils Absolute: 0.1 10*3/uL (ref 0.0–0.7)
Eosinophils Relative: 1.7 % (ref 0.0–5.0)
HCT: 44.4 % (ref 39.0–52.0)
Hemoglobin: 15 g/dL (ref 13.0–17.0)
Lymphocytes Relative: 35.6 % (ref 12.0–46.0)
Lymphs Abs: 2 10*3/uL (ref 0.7–4.0)
MCHC: 33.7 g/dL (ref 30.0–36.0)
MCV: 87.6 fl (ref 78.0–100.0)
Monocytes Absolute: 0.4 10*3/uL (ref 0.1–1.0)
Monocytes Relative: 7.4 % (ref 3.0–12.0)
Neutro Abs: 3.1 10*3/uL (ref 1.4–7.7)
Neutrophils Relative %: 55 % (ref 43.0–77.0)
Platelets: 184 10*3/uL (ref 150.0–400.0)
RBC: 5.07 Mil/uL (ref 4.22–5.81)
RDW: 14.1 % (ref 11.5–15.5)
WBC: 5.7 10*3/uL (ref 4.0–10.5)

## 2019-05-18 LAB — LIPASE: Lipase: 17 U/L (ref 11.0–59.0)

## 2019-05-18 LAB — POC URINALSYSI DIPSTICK (AUTOMATED)
Bilirubin, UA: NEGATIVE
Blood, UA: NEGATIVE
Glucose, UA: NEGATIVE
Ketones, UA: NEGATIVE
Leukocytes, UA: NEGATIVE
Nitrite, UA: NEGATIVE
Protein, UA: NEGATIVE
Spec Grav, UA: >=1.03 — AB (ref 1.010–1.025)
Urobilinogen, UA: 0.2 E.U./dL
pH, UA: 5.5 (ref 5.0–8.0)

## 2019-05-18 LAB — COMPREHENSIVE METABOLIC PANEL
ALT: 34 U/L (ref 0–53)
AST: 32 U/L (ref 0–37)
Albumin: 4.5 g/dL (ref 3.5–5.2)
Alkaline Phosphatase: 85 U/L (ref 39–117)
BUN: 16 mg/dL (ref 6–23)
CO2: 30 mEq/L (ref 19–32)
Calcium: 9.2 mg/dL (ref 8.4–10.5)
Chloride: 105 mEq/L (ref 96–112)
Creatinine, Ser: 0.88 mg/dL (ref 0.40–1.50)
GFR: 92.2 mL/min (ref >60.00)
Glucose, Bld: 91 mg/dL (ref 70–99)
Potassium: 4.4 mEq/L (ref 3.5–5.1)
Sodium: 141 mEq/L (ref 135–145)
Total Bilirubin: 0.7 mg/dL (ref 0.2–1.2)
Total Protein: 7.1 g/dL (ref 6.0–8.3)

## 2019-05-18 MED ORDER — OMEPRAZOLE 20 MG PO CPDR: 20.0000 mg | DELAYED_RELEASE_CAPSULE | Freq: Every day | ORAL | 1 refills | Status: DC

## 2019-05-18 MED ORDER — POLYETHYLENE GLYCOL 3350 17 GM/SCOOP PO POWD: 17.0000 g | Freq: Two times a day (BID) | ORAL | 0 refills | Status: DC | PRN

## 2019-05-18 NOTE — Addendum Note (Signed)
Addended by: Ria Bush on: 05/18/2019 04:16 PM   Modules accepted: Level of Service

## 2019-05-18 NOTE — Patient Instructions (Addendum)
Labs and urinalysis today. Treat as possible heartburn with omeprazole 78m daily for 2 weeks then as needed.  For lower abdominal pain will check abdominal xrays. Try daily miralax powder to loosen stools for the next week as well - with increased water.  If not improving with above or any worsening, let me know to discuss possible CT scan.

## 2019-05-18 NOTE — Assessment & Plan Note (Addendum)
Reviewed dietary control of GERD.  Treat with omeprazole 86m x 2 wks then PRN.

## 2019-05-18 NOTE — Progress Notes (Signed)
This visit was conducted in person.  BP 118/84 (BP Location: Right Arm, Patient Position: Sitting, Cuff Size: Normal)   Pulse 60   Temp (!) 97.3 F (36.3 C) (Temporal)   Ht 5' 2.5" (1.588 m)   Wt 133 lb 2 oz (60.4 kg)   SpO2 97%   BMI 23.96 kg/m    CC: abd pain Subjective:    Patient ID: Douglas [name], male    DOB: Aug 26, 1971, 48 y.o.   MRN: [id number]  HPI: Douglas [name] is a 48 y.o. male presenting on 05/18/2019 for Abdominal Pain (C/o LLQ pain radiating across abd. Also, c/o excessive gas. Started about 1 mo ago. Pain ranges 2-5. Sometimes eating helps pain. )   1 month h/o L lower abd pain described as dull, constant pain 2/10 but at its worse 5-6/10 pain, more noticeable with spicy foods. Some nausea. He does endorse dysuria "for a while" >1 mo associated with incomplete emptying - this is largely better. No urgency, frequency, hematuria. Some heartburn, indigestion, gassiness and bloating. Hasn't tried anything for this. Has limited caffeine intake. No alcohol.   No fevers/chills, diarrhea or constipation, blood in stool, vomiting.  No unexpected weight loss. Appetite ok.  1-2 stools per day, sometimes soft sometimes hard.   No fmhx colon cancer.      Relevant past medical, surgical, family and social history reviewed and updated as indicated. Interim medical history since our last visit reviewed. Allergies and medications reviewed and updated. Outpatient Medications Prior to Visit  Medication Sig Dispense Refill  . loratadine (CLARITIN) 10 MG tablet Take 10 mg by mouth daily.      Marland Kitchen ibuprofen (ADVIL,MOTRIN) 600 MG tablet Take 1 tablet (600 mg total) by mouth every 6 (six) hours as needed. 30 tablet 0  . methocarbamol (ROBAXIN) 500 MG tablet Take 1 tablet (500 mg total) by mouth 2 (two) times daily. 20 tablet 0  . ondansetron (ZOFRAN) 4 MG tablet Take 1 tablet (4 mg total) by mouth every 8 (eight) hours as needed. 20 tablet 0  . traMADol (ULTRAM) 50 MG tablet Take 1 tablet (50  mg total) by mouth every 6 (six) hours as needed. 15 tablet 0   No facility-administered medications prior to visit.      Per HPI unless specifically indicated in ROS section below Review of Systems Objective:    BP 118/84 (BP Location: Right Arm, Patient Position: Sitting, Cuff Size: Normal)   Pulse 60   Temp (!) 97.3 F (36.3 C) (Temporal)   Ht 5' 2.5" (1.588 m)   Wt 133 lb 2 oz (60.4 kg)   SpO2 97%   BMI 23.96 kg/m   Wt Readings from Last 3 Encounters:  05/18/19 133 lb 2 oz (60.4 kg)  05/28/15 130 lb (59 kg)  11/14/13 126 lb 8 oz (57.4 kg)    Physical Exam Vitals signs and nursing note reviewed.  Constitutional:      General: He is not in acute distress.    Appearance: Normal appearance. He is not ill-appearing.  HENT:     Head: Normocephalic and atraumatic.     Mouth/Throat:     Mouth: Mucous membranes are moist.     Pharynx: No posterior oropharyngeal erythema.  Cardiovascular:     Rate and Rhythm: Normal rate and regular rhythm.     Pulses: Normal pulses.     Heart sounds: Normal heart sounds. No murmur.  Pulmonary:     Effort: Pulmonary effort is normal. No respiratory distress.  Breath sounds: Normal breath sounds. No wheezing, rhonchi or rales.  Abdominal:     General: Abdomen is flat. Bowel sounds are normal. There is no distension.     Palpations: Abdomen is soft. There is no mass.     Tenderness: There is abdominal tenderness (mild-mod) in the left lower quadrant. There is left CVA tenderness. There is no right CVA tenderness, guarding or rebound. Negative signs include Murphy's sign.     Hernia: No hernia is present.  Musculoskeletal:     Right lower leg: No edema.     Left lower leg: No edema.  Skin:    Findings: No rash.  Neurological:     Mental Status: He is alert.  Psychiatric:        Mood and Affect: Mood normal.       Results for orders placed or performed in visit on 05/18/19  POCT Urinalysis Dipstick (Automated)  Result Value Ref  Range   Color, UA yellow    Clarity, UA clear    Glucose, UA Negative Negative   Bilirubin, UA negative    Ketones, UA negative    Spec Grav, UA >=1.030 (A) 1.010 - 1.025   Blood, UA negative    pH, UA 5.5 5.0 - 8.0   Protein, UA Negative Negative   Urobilinogen, UA 0.2 0.2 or 1.0 E.U./dL   Nitrite, UA negative    Leukocytes, UA Negative Negative   Assessment & Plan:   Problem List Items Addressed This Visit    LLQ abdominal pain - Primary    Endorses 1 mo h/o LLQ abd pain not associated with bowel changes or fever or red flags, as well as ongoing heartburn. Reviewed dietary precautions to control GERD, will also start omeprazole 90m x 2 wks then PRN. Possible constipation causing lower abdominal discomfort - will trial miralax. Check UA as well as labwork and abd xray. If ongoing pain, advised let uKoreaknow to consider CT abd/pelvis for further evaluation. Pt agrees with plan.  Xray - large stool burden - will trial miralax as above.       Relevant Orders   CBC with Differential/Platelet   Comprehensive metabolic panel   Lipase   DG Abd 2 Views (Completed)   POCT Urinalysis Dipstick (Automated) (Completed)   Heartburn    Reviewed dietary control of GERD.  Treat with omeprazole 236mx 2 wks then PRN.           Meds ordered this encounter  Medications  . omeprazole (PRILOSEC) 20 MG capsule    Sig: Take 1 capsule (20 mg total) by mouth daily. For 2 weeks then as needed for heartburn    Dispense:  30 capsule    Refill:  1  . polyethylene glycol powder (GLYCOLAX/MIRALAX) 17 GM/SCOOP powder    Sig: Take 17 g by mouth 2 (two) times daily as needed for mild constipation.    Dispense:  507 g    Refill:  0   Orders Placed This Encounter  Procedures  . DG Abd 2 Views    Standing Status:   Future    Number of Occurrences:   1    Standing Expiration Date:   07/17/2020    Order Specific Question:   Reason for Exam (SYMPTOM  OR DIAGNOSIS REQUIRED)    Answer:   LLQ abd pain     Order Specific Question:   Preferred imaging location?    Answer:   [facility]  Order Specific Question:  Radiology Contrast Protocol - do NOT remove file path    Answer:   \\[email or web address].pdf  . CBC with Differential/Platelet  . Comprehensive metabolic panel  . Lipase  . POCT Urinalysis Dipstick (Automated)    Patient Instructions  Labs and urinalysis today. Treat as possible heartburn with omeprazole 49m daily for 2 weeks then as needed.  For lower abdominal pain will check abdominal xrays. Try daily miralax powder to loosen stools for the next week as well - with increased water.  If not improving with above or any worsening, let me know to discuss possible CT scan.    Follow up plan: Return if symptoms worsen or fail to improve.  JRia Bush MD

## 2019-05-18 NOTE — Assessment & Plan Note (Addendum)
Endorses 1 mo h/o LLQ abd pain not associated with bowel changes or fever or red flags, as well as ongoing heartburn. Reviewed dietary precautions to control GERD, will also start omeprazole 32m x 2 wks then PRN. Possible constipation causing lower abdominal discomfort - will trial miralax. Check UA as well as labwork and abd xray. If ongoing pain, advised let uKoreaknow to consider CT abd/pelvis for further evaluation. Pt agrees with plan.  Xray - large stool burden - will trial miralax as above.

## 2019-05-19 HISTORY — PX: COLONOSCOPY: SHX174

## 2019-05-31 ENCOUNTER — Ambulatory Visit: Payer: 59 | Admitting: Family Medicine

## 2019-05-31 ENCOUNTER — Encounter: Payer: Self-pay | Admitting: Family Medicine

## 2019-05-31 ENCOUNTER — Telehealth: Payer: Self-pay | Admitting: Family Medicine

## 2019-05-31 ENCOUNTER — Other Ambulatory Visit: Payer: Self-pay

## 2019-05-31 VITALS — BP 118/68 | HR 85 | Temp 98.3°F | Ht 62.5 in | Wt 133.2 lb

## 2019-05-31 DIAGNOSIS — R1032 Left lower quadrant pain: Secondary | ICD-10-CM | POA: Diagnosis not present

## 2019-05-31 DIAGNOSIS — R12 Heartburn: Secondary | ICD-10-CM

## 2019-05-31 NOTE — Telephone Encounter (Signed)
Spoke with patient this afternoon and he just wanted Dr Darnell Level to be aware that after he got home today he had some diarrhea which he hasnt had until today.CT is scheduled for Friday, tomorrow for 12:00pm at [facility]

## 2019-05-31 NOTE — Patient Instructions (Signed)
Pass by lab to pick up stool kit We will check CT scan for further evaluation of left sided abdominal pain.  We will be in touch with results.

## 2019-05-31 NOTE — Telephone Encounter (Signed)
Noted. Thanks.

## 2019-05-31 NOTE — Assessment & Plan Note (Signed)
Ongoing over last 1.5 months, no significant improvement with miralax course. labwork was reassuring. However given ongoing pain will check contrasted CT abd/pelvis for further eval, r/o diverticulitis, mass, etc. Will also send iFOB. Pt agrees with plan.

## 2019-05-31 NOTE — Progress Notes (Signed)
This visit was conducted in person.  BP 118/68 (BP Location: Left Arm, Patient Position: Sitting, Cuff Size: Normal)   Pulse 85   Temp 98.3 F (36.8 C) (Temporal)   Ht 5' 2.5" (1.588 m)   Wt 133 lb 4 oz (60.4 kg)   SpO2 98%   BMI 23.98 kg/m    CC: abd pain Subjective:    Patient ID: Cristofer Yaffe, male    DOB: [birth year], 48 y.o.   MRN: [id number]  HPI: Cleon Signorelli is a 48 y.o. male presenting on 05/31/2019 for Abdominal Pain (C/o still having LLQ abd pain radiating across abd. Has had about 1.5 mos. )   See prior note for details. Seen here 2 wks ago with lower abd discomfort described as dull constant pain, worse with spicy foods. Workup included normal labwork, UA and abd xray showing large stool burden. Treated for presumed constipation with miralax course (he has taken regularly BID) and for possible GERD contributing with omeprazole 37m daily x 2 wks with benefit. Ongoing symptoms for the last 1.5 months.   Ongoing lower abdominal pain traveling from LLQ to right side. Continues normal stools daily. Some intermittent L lower back pain for 6-7 months.   No rectal pain.  No fevers/chills, diarrhea or constipation, blood in stool, vomiting.  No unexpected weight loss. Appetite ok.  No fmhx colon cancer.   No diet changes. No significant NSAID use. Alcohol - none. Very limited caffeine. Limiting spicy foods.      Relevant past medical, surgical, family and social history reviewed and updated as indicated. Interim medical history since our last visit reviewed. Allergies and medications reviewed and updated. Outpatient Medications Prior to Visit  Medication Sig Dispense Refill  . loratadine (CLARITIN) 10 MG tablet Take 10 mg by mouth daily.      .Marland Kitchenomeprazole (PRILOSEC) 20 MG capsule Take 1 capsule (20 mg total) by mouth daily. For 2 weeks then as needed for heartburn 30 capsule 1  . polyethylene glycol powder (GLYCOLAX/MIRALAX) 17 GM/SCOOP powder Take 17 g by mouth 2 (two) times  daily as needed for mild constipation. 507 g 0   No facility-administered medications prior to visit.      Per HPI unless specifically indicated in ROS section below Review of Systems Objective:    BP 118/68 (BP Location: Left Arm, Patient Position: Sitting, Cuff Size: Normal)   Pulse 85   Temp 98.3 F (36.8 C) (Temporal)   Ht 5' 2.5" (1.588 m)   Wt 133 lb 4 oz (60.4 kg)   SpO2 98%   BMI 23.98 kg/m   Wt Readings from Last 3 Encounters:  05/31/19 133 lb 4 oz (60.4 kg)  05/18/19 133 lb 2 oz (60.4 kg)  05/28/15 130 lb (59 kg)    Physical Exam Vitals signs and nursing note reviewed.  Constitutional:      General: He is not in acute distress.    Appearance: Normal appearance. He is not ill-appearing.  HENT:     Mouth/Throat:     Mouth: Mucous membranes are moist.     Pharynx: No posterior oropharyngeal erythema.  Eyes:     Extraocular Movements: Extraocular movements intact.     Pupils: Pupils are equal, round, and reactive to light.  Abdominal:     General: Abdomen is flat. Bowel sounds are normal. There is no distension.     Palpations: Abdomen is soft. There is no mass.     Tenderness: There is abdominal tenderness (moderate) in the  left lower quadrant. There is no right CVA tenderness, left CVA tenderness, guarding or rebound. Negative signs include Murphy's sign.     Hernia: No hernia is present.  Musculoskeletal: Normal range of motion.     Comments:  Mild discomfort to palpation of midline mid lumbar spine No paraspinous mm tenderness Neg SLR on left, no pain with int/ext rotation at hip  Neurological:     Mental Status: He is alert.       Results for orders placed or performed in visit on 05/18/19  CBC with Differential/Platelet  Result Value Ref Range   WBC 5.7 4.0 - 10.5 K/uL   RBC 5.07 4.22 - 5.81 Mil/uL   Hemoglobin 15.0 13.0 - 17.0 g/dL   HCT 44.4 39.0 - 52.0 %   MCV 87.6 78.0 - 100.0 fl   MCHC 33.7 30.0 - 36.0 g/dL   RDW 14.1 11.5 - 15.5 %    Platelets 184.0 150.0 - 400.0 K/uL   Neutrophils Relative % 55.0 43.0 - 77.0 %   Lymphocytes Relative 35.6 12.0 - 46.0 %   Monocytes Relative 7.4 3.0 - 12.0 %   Eosinophils Relative 1.7 0.0 - 5.0 %   Basophils Relative 0.3 0.0 - 3.0 %   Neutro Abs 3.1 1.4 - 7.7 K/uL   Lymphs Abs 2.0 0.7 - 4.0 K/uL   Monocytes Absolute 0.4 0.1 - 1.0 K/uL   Eosinophils Absolute 0.1 0.0 - 0.7 K/uL   Basophils Absolute 0.0 0.0 - 0.1 K/uL  Comprehensive metabolic panel  Result Value Ref Range   Sodium 141 135 - 145 mEq/L   Potassium 4.4 3.5 - 5.1 mEq/L   Chloride 105 96 - 112 mEq/L   CO2 30 19 - 32 mEq/L   Glucose, Bld 91 70 - 99 mg/dL   BUN 16 6 - 23 mg/dL   Creatinine, Ser 0.88 0.40 - 1.50 mg/dL   Total Bilirubin 0.7 0.2 - 1.2 mg/dL   Alkaline Phosphatase 85 39 - 117 U/L   AST 32 0 - 37 U/L   ALT 34 0 - 53 U/L   Total Protein 7.1 6.0 - 8.3 g/dL   Albumin 4.5 3.5 - 5.2 g/dL   Calcium 9.2 8.4 - 10.5 mg/dL   GFR 92.20 >60.00 mL/min  Lipase  Result Value Ref Range   Lipase 17.0 11.0 - 59.0 U/L  POCT Urinalysis Dipstick (Automated)  Result Value Ref Range   Color, UA yellow    Clarity, UA clear    Glucose, UA Negative Negative   Bilirubin, UA negative    Ketones, UA negative    Spec Grav, UA >=1.030 (A) 1.010 - 1.025   Blood, UA negative    pH, UA 5.5 5.0 - 8.0   Protein, UA Negative Negative   Urobilinogen, UA 0.2 0.2 or 1.0 E.U./dL   Nitrite, UA negative    Leukocytes, UA Negative Negative   DG Abd 2 Views CLINICAL DATA:  LLQ abd pain  EXAM: ABDOMEN - 2 VIEW  COMPARISON:  None.  FINDINGS: Paucity of small bowel gas but no dilated loops to suggest obstruction. Air and stool are seen throughout the colon. No evidence of intraperitoneal free air. Visualized lung bases are clear. No acute osseous abnormality in visualized bones.  IMPRESSION: Nonobstructive bowel gas pattern.  Electronically Signed   By: Audie Pinto M.D.   On: 05/18/2019 10:51   Assessment & Plan:    Problem List Items Addressed This Visit    LLQ abdominal pain - Primary  Ongoing over last 1.5 months, no significant improvement with miralax course. labwork was reassuring. However given ongoing pain will check contrasted CT abd/pelvis for further eval, r/o diverticulitis, mass, etc. Will also send iFOB. Pt agrees with plan.       Relevant Orders   Fecal occult blood, imunochemical   CT Abdomen Pelvis W Contrast   Heartburn    This has significantly improved after omeprazole course.       Other Visit Diagnoses    Left lower quadrant abdominal pain       Relevant Orders   CT Abdomen Pelvis W Contrast       No orders of the defined types were placed in this encounter.  Orders Placed This Encounter  Procedures  . Fecal occult blood, imunochemical    Standing Status:   Future    Standing Expiration Date:   05/30/2020  . CT Abdomen Pelvis W Contrast    Standing Status:   Future    Standing Expiration Date:   08/30/2020    Order Specific Question:   ** REASON FOR EXAM (FREE TEXT)    Answer:   LLQ abd pain, unrevealing xray    Order Specific Question:   If indicated for the ordered procedure, I authorize the administration of contrast media per Radiology protocol    Answer:   Yes    Order Specific Question:   Preferred imaging location?    Answer:   [facility]    Order Specific Question:   Is Oral Contrast requested for this exam?    Answer:   Yes, Per Radiology protocol    Order Specific Question:   Radiology Contrast Protocol - do NOT remove file path    Answer:   \\[email or web address]    Follow up plan: No follow-ups on file.  Ria Bush, MD

## 2019-05-31 NOTE — Assessment & Plan Note (Signed)
This has significantly improved after omeprazole course.

## 2019-06-01 ENCOUNTER — Other Ambulatory Visit: Payer: Self-pay | Admitting: Family Medicine

## 2019-06-01 ENCOUNTER — Other Ambulatory Visit (INDEPENDENT_AMBULATORY_CARE_PROVIDER_SITE_OTHER): Payer: 59

## 2019-06-01 ENCOUNTER — Telehealth: Payer: Self-pay | Admitting: Radiology

## 2019-06-01 ENCOUNTER — Ambulatory Visit
Admission: RE | Admit: 2019-06-01 | Discharge: 2019-06-01 | Disposition: A | Discharge status: Home / Self Care | Payer: 59 | Source: Ambulatory Visit | Attending: Family Medicine | Admitting: Family Medicine

## 2019-06-01 DIAGNOSIS — R1032 Left lower quadrant pain: Secondary | ICD-10-CM | POA: Insufficient documentation

## 2019-06-01 DIAGNOSIS — K5100 Ulcerative (chronic) pancolitis without complications: Secondary | ICD-10-CM

## 2019-06-01 LAB — FECAL OCCULT BLOOD, IMMUNOCHEMICAL: Fecal Occult Bld: POSITIVE — AB

## 2019-06-01 MED ORDER — IOHEXOL 300 MG/ML  SOLN
100.0000 mL | Freq: Once | INTRAMUSCULAR | Status: AC | PRN
  Administered 2019-06-01: 100 mL via INTRAVENOUS

## 2019-06-01 MED ORDER — METRONIDAZOLE 500 MG PO TABS: 500.0000 mg | ORAL_TABLET | Freq: Three times a day (TID) | ORAL | 0 refills | Status: DC

## 2019-06-01 NOTE — Telephone Encounter (Signed)
Noted.  See result note.  

## 2019-06-01 NOTE — Telephone Encounter (Signed)
[facility] called a POSITIVE ifob, results given to Dr Danise Mina

## 2019-06-05 ENCOUNTER — Encounter: Payer: Self-pay | Admitting: Physician Assistant

## 2019-06-06 ENCOUNTER — Ambulatory Visit: Admission: RE | Admit: 2019-06-06 | Payer: 59 | Source: Ambulatory Visit

## 2019-06-06 ENCOUNTER — Telehealth: Payer: Self-pay | Admitting: Family Medicine

## 2019-06-06 NOTE — Telephone Encounter (Signed)
Received a call from the patient that he is still having Left sided pain. I called [facility] to ask about a sooner New GI Appt than 06/19/19 when he is scheduled to see Ellouise Newer but there are no sooner appointments to get him in with anyone.He says the pain is about a 5 level and it comes and goes and it is worse when he moves around and when he is still the pain is about a 3.  Please advise what you suggest the patient do until 06/19/2019. He can be reached at [phone]

## 2019-06-06 NOTE — Telephone Encounter (Signed)
[facility] was able to get patient in to see Dr Carlean Purl tomorrow, 06/07/19. Patient called and notifed of New patient Appt with Dr Carlean Purl.

## 2019-06-07 ENCOUNTER — Ambulatory Visit (INDEPENDENT_AMBULATORY_CARE_PROVIDER_SITE_OTHER): Payer: 59 | Admitting: Internal Medicine

## 2019-06-07 ENCOUNTER — Encounter: Payer: Self-pay | Admitting: Internal Medicine

## 2019-06-07 ENCOUNTER — Other Ambulatory Visit: Payer: 59

## 2019-06-07 VITALS — BP 88/60 | HR 61 | Ht 62.5 in | Wt 133.0 lb

## 2019-06-07 DIAGNOSIS — R21 Rash and other nonspecific skin eruption: Secondary | ICD-10-CM | POA: Diagnosis not present

## 2019-06-07 DIAGNOSIS — R933 Abnormal findings on diagnostic imaging of other parts of digestive tract: Secondary | ICD-10-CM | POA: Diagnosis not present

## 2019-06-07 DIAGNOSIS — R1032 Left lower quadrant pain: Secondary | ICD-10-CM | POA: Diagnosis not present

## 2019-06-07 DIAGNOSIS — K769 Liver disease, unspecified: Secondary | ICD-10-CM

## 2019-06-07 DIAGNOSIS — R195 Other fecal abnormalities: Secondary | ICD-10-CM | POA: Diagnosis not present

## 2019-06-07 MED ORDER — PLENVU 140 G PO SOLR: 1.0000 | Freq: Once | ORAL | 0 refills | Status: AC

## 2019-06-07 NOTE — Patient Instructions (Addendum)
You have been scheduled for a colonoscopy. Please follow written instructions given to you at your visit today.  Please use the Plenvu kit you have been given today. If you use inhalers (even only as needed), please bring them with you on the day of your procedure.   Your provider has requested that you go to the basement level for lab work before leaving today. Press "B" on the elevator. The lab is located at the first door on the left as you exit the elevator.   Normal BMI (Body Mass Index- based on height and weight) is between 19 and 25. Your BMI today is Body mass index is 23.94 kg/m. Marland Kitchen Please consider follow up  regarding your BMI with your Primary Care Provider.    I appreciate the opportunity to care for you. Silvano Rusk, MD, [facility]

## 2019-06-07 NOTE — Progress Notes (Addendum)
Douglas [name] 48 y.o. 1970/12/12 [id number] Referred by: Ria Bush, MD  Assessment & Plan:   Encounter Diagnoses  Name Primary?  . Abnormal CT scan, colon   . LLQ pain   . Loose stools   . Liver lesion, right lobe   . Rash and nonspecific skin eruption Yes     Evaluate with colonoscopy to determine the cause of abnormal colon, pain and now loose stools.  It is possible the loose stools could be from his metronidazole.  Alpha gal panel to evaluate the rash related to eating beef or pork.  Eventual MRI of the liver to evaluate the liver lesion.  The risks and benefits as well as alternatives of endoscopic procedure(s) have been discussed and reviewed. All questions answered. The patient agrees to proceed.   I appreciate the opportunity to care for this patient. CC: Ria Bush, MD   Subjective:   Chief Complaint: Abdominal pain abnormal colon on CT scan  HPI The patient is a 48 year old man originally from [place], who is had a 45-monthhistory of abdominal pain mainly in the left lower quadrant.  It varies in intensity, it can be severe it can be mild and it is there most if not every day.  He has not had diarrhea or bowel changes and his labs have been normal i.e. CBC chemistries.  No fevers.  No bleeding.  He had a CT scan about 1 week ago images reviewed, he had a 2.6 cm right liver lobe lesion not consistent with a cyst question hemangioma, and some diffuse "probable thickening" of the colon.  No diverticulitis or other lesions.  He has been put on metronidazole to see if that would help, its not making a difference, it does cause nausea.  In the last week he has had some loose but not watery stools.  No fevers or chills.  He is never really had problems like this before except he does say when his mother died in 2March 16, 2004he had some abdominal pain but it did not last this long.  He also mentions that when he eats beef and pork he gets a skin rash. The patient likes to  hunt and is in the woods a lot, and since 2March 16, 2004whenever he eats beef or pork he gets a skin rash.  Does not recall a tick bite necessarily is able to eat venison is able to eat poultry without problems no respiratory issues No Known Allergies Current Meds  Medication Sig  . loratadine (CLARITIN) 10 MG tablet Take 10 mg by mouth daily.    . metroNIDAZOLE (FLAGYL) 500 MG tablet Take 500 mg by mouth 3 (three) times daily.  . naproxen sodium (ALEVE) 220 MG tablet Take 220 mg by mouth as needed (for back pain).  . [DISCONTINUED] metroNIDAZOLE (FLAGYL) 500 MG tablet Take 1 tablet (500 mg total) by mouth 3 (three) times daily.   Past Medical History:  Diagnosis Date  . Concussion   . Dyslipidemia   . Epistaxis   . GERD (gastroesophageal reflux disease)   . Headache(784.0)   . Seasonal allergic rhinitis    worse in spring and fall  . Sinusitis, chronic    Past Surgical History:  Procedure Laterality Date  . 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   Review of Systems As per HPI all other review of systems negative  Objective:   Physical Exam @BP  (!) 88/60   Pulse 61   Ht 5' 2.5" (1.588 m)   Wt 133 lb (60.3 kg)   BMI 23.94 kg/m @  General:  Well-developed, well-nourished and in no acute distress Eyes:  anicteric. ENT:   Mouth and posterior pharynx free of lesions.  Neck:   supple w/o thyromegaly or mass.  Lungs: Clear to auscultation bilaterally. Heart:  S1S2, no rubs, murmurs, gallops. Abdomen:  soft, mildly-tender left lower quadrant, no  hepatosplenomegaly, hernia, or mass and BS+.  No pain with abdominal wall tension Rectal: Deferred Lymph:  no cervical or supraclavicular adenopathy. Extremities:   no edema, cyanosis or clubbing Skin  erythematous scaly papular rash on the right thigh Neuro:  A&O x 3.  Psych:  appropriate mood and  Affect.   Data Reviewed:  See HPI

## 2019-06-08 ENCOUNTER — Encounter: Payer: Self-pay | Admitting: Internal Medicine

## 2019-06-08 ENCOUNTER — Ambulatory Visit (AMBULATORY_SURGERY_CENTER): Payer: 59 | Admitting: Internal Medicine

## 2019-06-08 ENCOUNTER — Other Ambulatory Visit: Payer: Self-pay

## 2019-06-08 VITALS — BP 113/74 | HR 57 | Temp 98.4°F | Resp 16 | Ht 62.0 in | Wt 133.0 lb

## 2019-06-08 DIAGNOSIS — D120 Benign neoplasm of cecum: Secondary | ICD-10-CM

## 2019-06-08 DIAGNOSIS — D122 Benign neoplasm of ascending colon: Secondary | ICD-10-CM | POA: Diagnosis not present

## 2019-06-08 DIAGNOSIS — K5730 Diverticulosis of large intestine without perforation or abscess without bleeding: Secondary | ICD-10-CM

## 2019-06-08 DIAGNOSIS — K529 Noninfective gastroenteritis and colitis, unspecified: Secondary | ICD-10-CM | POA: Diagnosis present

## 2019-06-08 MED ORDER — DICYCLOMINE HCL 20 MG PO TABS: 20.0000 mg | ORAL_TABLET | Freq: Four times a day (QID) | ORAL | 0 refills | Status: DC | PRN

## 2019-06-08 MED ORDER — SODIUM CHLORIDE 0.9 % IV SOLN: 500.0000 mL | Freq: Once | INTRAVENOUS | Status: DC

## 2019-06-08 NOTE — Progress Notes (Signed)
To PACU, VSS. Report to [email or web address] 

## 2019-06-08 NOTE — Patient Instructions (Addendum)
I found 2 tiny colon polyps - removed them.  There was some areas of red color changes in the colon that usually mean it is inflamed. I took biopsies to understand it better.  I will call with results and plans.  You do not need to take the metronidazole any more.  I appreciate the opportunity to care for you. Gatha Mayer, MD, [facility]   Handout given for polyps and diverticulosis.   YOU HAD AN ENDOSCOPIC PROCEDURE TODAY AT [facility]:   Refer to the procedure report that was given to you for any specific questions about what was found during the examination.  If the procedure report does not answer your questions, please call your gastroenterologist to clarify.  If you requested that your care partner not be given the details of your procedure findings, then the procedure report has been included in a sealed envelope for you to review at your convenience later.  YOU SHOULD EXPECT: Some feelings of bloating in the abdomen. Passage of more gas than usual.  Walking can help get rid of the air that was put into your GI tract during the procedure and reduce the bloating. If you had a lower endoscopy (such as a colonoscopy or flexible sigmoidoscopy) you may notice spotting of blood in your stool or on the toilet paper. If you underwent a bowel prep for your procedure, you may not have a normal bowel movement for a few days.  Please Note:  You might notice some irritation and congestion in your nose or some drainage.  This is from the oxygen used during your procedure.  There is no need for concern and it should clear up in a day or so.  SYMPTOMS TO REPORT IMMEDIATELY:   Following lower endoscopy (colonoscopy or flexible sigmoidoscopy):  Excessive amounts of blood in the stool  Significant tenderness or worsening of abdominal pains  Swelling of the abdomen that is new, acute  Fever of 100F or higher  For urgent or emergent issues, a gastroenterologist can be reached  at any hour by calling [phone].   DIET:  We do recommend a small meal at first, but then you may proceed to your regular diet.  Drink plenty of fluids but you should avoid alcoholic beverages for 24 hours.  ACTIVITY:  You should plan to take it easy for the rest of today and you should NOT DRIVE or use heavy machinery until tomorrow (because of the sedation medicines used during the test).    FOLLOW UP: Our staff will call the number listed on your records 48-72 hours following your procedure to check on you and address any questions or concerns that you may have regarding the information given to you following your procedure. If we do not reach you, we will leave a message.  We will attempt to reach you two times.  During this call, we will ask if you have developed any symptoms of COVID 19. If you develop any symptoms (ie: fever, flu-like symptoms, shortness of breath, cough etc.) before then, please call [phone].  If you test positive for Covid 19 in the 2 weeks post procedure, please call and report this information to [place].    If any biopsies were taken you will be contacted by phone or by letter within the next 1-3 weeks.  Please call us at [phone] if you have not heard about the biopsies in 3 weeks.    SIGNATURES/CONFIDENTIALITY: You and/or your care partner have  signed paperwork which will be entered into your electronic medical record.  These signatures attest to the fact that that the information above on your After Visit Summary has been reviewed and is understood.  Full responsibility of the confidentiality of this discharge information lies with you and/or your care-partner.

## 2019-06-08 NOTE — Op Note (Signed)
[facility] Patient Name: Douglas [name] Procedure Date: 06/08/2019 2:12 PM MRN: [id number] Endoscopist: Gatha Mayer , MD Age: 48 Referring MD:  Date of Birth: 06-Jan-1971 Gender: Male Account #: [id number] Procedure:                Colonoscopy Indications:              Abnormal CT of the GI tract Medicines:                Propofol per Anesthesia, Monitored Anesthesia Care Procedure:                Pre-Anesthesia Assessment:                           - Prior to the procedure, a History and Physical                            was performed, and patient medications and                            allergies were reviewed. The patient's tolerance of                            previous anesthesia was also reviewed. The risks                            and benefits of the procedure and the sedation                            options and risks were discussed with the patient.                            All questions were answered, and informed consent                            was obtained. Prior Anticoagulants: The patient has                            taken no previous anticoagulant or antiplatelet                            agents. ASA Grade Assessment: II - A patient with                            mild systemic disease. After reviewing the risks                            and benefits, the patient was deemed in                            satisfactory condition to undergo the procedure.                           After obtaining informed consent, the colonoscope  was passed under direct vision. Throughout the                            procedure, the patient's blood pressure, pulse, and                            oxygen saturations were monitored continuously. The                            Colonoscope was introduced through the anus and                            advanced to the the terminal ileum, with                            identification of the  appendiceal orifice and IC                            valve. The colonoscopy was performed without                            difficulty. The patient tolerated the procedure                            well. Scope In: 2:30:01 PM Scope Out: 2:44:29 PM Scope Withdrawal Time: 0 hours 12 minutes 4 seconds  Total Procedure Duration: 0 hours 14 minutes 28 seconds  Findings:                 The perianal and digital rectal examinations were                            normal. Pertinent negatives include normal prostate                            (size, shape, and consistency).                           Two sessile polyps were found in the ascending                            colon and cecum. The polyps were diminutive in                            size. These polyps were removed with a cold snare.                            Resection and retrieval were complete. Verification                            of patient identification for the specimen was                            done. Estimated blood loss was minimal.  A patchy area of mildly erythematous mucosa was                            found in the sigmoid colon, in the descending colon                            and in the transverse colon. Biopsies were taken                            with a cold forceps for histology. Verification of                            patient identification for the specimen was done.                            Estimated blood loss was minimal. Estimated blood                            loss was minimal.                           The terminal ileum appeared normal.                           Multiple diverticula were found in the sigmoid                            colon.                           The exam was otherwise without abnormality on                            direct and retroflexion views. Complications:            No immediate complications. Estimated Blood Loss:     Estimated blood  loss was minimal. Impression:               - Two diminutive polyps in the ascending colon and                            in the cecum, removed with a cold snare. Resected                            and retrieved.                           - Erythematous mucosa in the sigmoid colon, in the                            descending colon and in the transverse colon.                            Biopsied.                           -  The examined portion of the ileum was normal.                           - Diverticulosis in the sigmoid colon.                           - The examination was otherwise normal on direct                            and retroflexion views. Recommendation:           - Patient has a contact number available for                            emergencies. The signs and symptoms of potential                            delayed complications were discussed with the                            patient. Return to normal activities tomorrow.                            Written discharge instructions were provided to the                            patient.                           - Resume previous diet.                           - Continue present medications.                           - Await pathology results.                           - Repeat colonoscopy is recommended. The                            colonoscopy date will be determined after pathology                            results from today's exam become available for                            review.                           - Try dicyclomine prn Gatha Mayer, MD 06/08/2019 3:00:22 PM This report has been signed electronically.

## 2019-06-12 ENCOUNTER — Telehealth: Payer: Self-pay | Admitting: *Deleted

## 2019-06-12 NOTE — Telephone Encounter (Signed)
  Follow up Call-  Call back number 06/08/2019  Post procedure Call Back phone  # [phone]  Permission to leave phone message Yes  Some recent data might be hidden     Patient questions:  Do you have a fever, pain , or abdominal swelling? No. Pain Score  0 *  Have you tolerated food without any problems? Yes.    Have you been able to return to your normal activities? Yes.    Do you have any questions about your discharge instructions: Diet   No. Medications  No. Follow up visit  No.  Do you have questions or concerns about your Care? Yes.    Actions: * If pain score is 4 or above: No action needed, pain <4.

## 2019-06-13 ENCOUNTER — Encounter: Payer: Self-pay | Admitting: Internal Medicine

## 2019-06-13 ENCOUNTER — Other Ambulatory Visit: Payer: Self-pay | Admitting: Internal Medicine

## 2019-06-13 DIAGNOSIS — Z91018 Allergy to other foods: Secondary | ICD-10-CM

## 2019-06-13 HISTORY — DX: Allergy to other foods: Z91.018

## 2019-06-13 LAB — ALPHA-GAL PANEL
Beef IgE: 2.27 kU/L — ABNORMAL HIGH (ref <0.35)
Class: 2
Class: 2
Galactose-alpha-1,3-galactose IgE: 3.58 kU/L — ABNORMAL HIGH (ref <0.10)
LAMB/MUTTON IGE: 0.21 kU/L (ref <0.35)
Pork IgE: 1.03 kU/L — ABNORMAL HIGH (ref <0.35)

## 2019-06-13 MED ORDER — EPINEPHRINE 0.3 MG/0.3ML IJ SOAJ: 0.3000 mg | INTRAMUSCULAR | 0 refills | Status: AC | PRN

## 2019-06-14 ENCOUNTER — Other Ambulatory Visit: Payer: Self-pay

## 2019-06-14 ENCOUNTER — Encounter: Payer: Self-pay | Admitting: Internal Medicine

## 2019-06-14 DIAGNOSIS — Z860101 Personal history of adenomatous and serrated colon polyps: Secondary | ICD-10-CM

## 2019-06-14 DIAGNOSIS — D1803 Hemangioma of intra-abdominal structures: Secondary | ICD-10-CM | POA: Insufficient documentation

## 2019-06-14 DIAGNOSIS — K769 Liver disease, unspecified: Secondary | ICD-10-CM

## 2019-06-14 DIAGNOSIS — Z86010 Personal history of colonic polyps: Secondary | ICD-10-CM

## 2019-06-14 DIAGNOSIS — Z91018 Allergy to other foods: Secondary | ICD-10-CM

## 2019-06-14 HISTORY — DX: Personal history of colonic polyps: Z86.010

## 2019-06-14 HISTORY — DX: Personal history of adenomatous and serrated colon polyps: Z86.0101

## 2019-06-14 NOTE — Progress Notes (Signed)
Have communicated by My Chart about results  No letter  2027 colon  recall for adenomas x 2 and diminutive

## 2019-06-14 NOTE — Progress Notes (Signed)
He has alpha gal allergy I have communicated by My Chart to him and Rxed Epi Pen Please [facility]  Also he needs an MR liver with and without contrast to evaluate liver lesion seen on CT abd/pelvis - probably needs BUN/creatinine  Also set up f/u me next available

## 2019-06-18 ENCOUNTER — Encounter: Payer: Self-pay | Admitting: Family Medicine

## 2019-06-19 ENCOUNTER — Ambulatory Visit: Payer: 59 | Admitting: Physician Assistant

## 2019-06-19 DIAGNOSIS — D1803 Hemangioma of intra-abdominal structures: Secondary | ICD-10-CM

## 2019-06-19 HISTORY — DX: Hemangioma of intra-abdominal structures: D18.03

## 2019-06-22 ENCOUNTER — Encounter: Payer: Self-pay | Admitting: Internal Medicine

## 2019-06-22 ENCOUNTER — Other Ambulatory Visit: Payer: Self-pay

## 2019-06-22 ENCOUNTER — Ambulatory Visit (HOSPITAL_COMMUNITY)
Admission: RE | Admit: 2019-06-22 | Discharge: 2019-06-22 | Disposition: A | Discharge status: Home / Self Care | Payer: 59 | Source: Ambulatory Visit | Attending: Internal Medicine | Admitting: Internal Medicine

## 2019-06-22 DIAGNOSIS — K769 Liver disease, unspecified: Secondary | ICD-10-CM | POA: Diagnosis not present

## 2019-06-22 MED ORDER — GADOBUTROL 1 MMOL/ML IV SOLN
6.0000 mL | Freq: Once | INTRAVENOUS | Status: AC | PRN
  Administered 2019-06-22: 09:00:00 6 mL via INTRAVENOUS

## 2019-07-05 ENCOUNTER — Other Ambulatory Visit: Payer: Self-pay

## 2019-07-05 ENCOUNTER — Ambulatory Visit (INDEPENDENT_AMBULATORY_CARE_PROVIDER_SITE_OTHER): Payer: 59 | Admitting: Allergy & Immunology

## 2019-07-05 ENCOUNTER — Encounter: Payer: Self-pay | Admitting: Allergy & Immunology

## 2019-07-05 ENCOUNTER — Telehealth: Payer: Self-pay

## 2019-07-05 ENCOUNTER — Other Ambulatory Visit: Payer: Self-pay | Admitting: Internal Medicine

## 2019-07-05 VITALS — BP 110/62 | HR 61 | Temp 97.7°F | Resp 18 | Ht 62.5 in | Wt 131.0 lb

## 2019-07-05 DIAGNOSIS — L500 Allergic urticaria: Secondary | ICD-10-CM | POA: Diagnosis not present

## 2019-07-05 DIAGNOSIS — T7800XD Anaphylactic reaction due to unspecified food, subsequent encounter: Secondary | ICD-10-CM | POA: Diagnosis not present

## 2019-07-05 MED ORDER — DICYCLOMINE HCL 20 MG PO TABS: 20.0000 mg | ORAL_TABLET | Freq: Four times a day (QID) | ORAL | 0 refills | Status: DC | PRN

## 2019-07-05 NOTE — Telephone Encounter (Signed)
May I refill patients dicyclomine 54m to use prn?

## 2019-07-05 NOTE — Telephone Encounter (Signed)
Dr Carlean Purl sent in a refill on his dicyclomine.

## 2019-07-05 NOTE — Patient Instructions (Addendum)
1. 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 - Information on alpha gal provided. - We are going to get some labs to look for soy, wheat, milk, and yeast IgE (allergy levels). - We will call you in 1-2 weeks with the results of the testing.  - Anaphylaxis management plan provided. - EpiPen training provided.   2. Return in about 1 year (around 07/04/2020). This can be an in-person, a virtual Webex or a telephone follow up visit.   Please inform us of any Emergency Department visits, hospitalizations, or changes in symptoms. Call us before going to the ED for breathing or allergy symptoms since we might be able to fit you in for a sick visit. Feel free to contact us anytime with any questions, problems, or concerns.  It was a pleasure to meet you today!  Websites that have reliable patient information: 1. American Academy of Asthma, Allergy, and Immunology: www.aaaai.org 2. Food Allergy Research and Education (FARE): foodallergy.org 3. Mothers of Asthmatics: http://www.asthmacommunitynetwork.org 4. American College of Allergy, Asthma, and Immunology: www.acaai.org  Like [place] on [place] and Instagram for our latest updates!      Make sure you are registered to vote! If you have moved or changed any of your contact information, you will need to get this updated before voting!  In some cases, you MAY be able to register to vote online: [email or web address]    Voter ID laws are NOT going into effect for the General Election in November 2020! DO NOT let this stop you from exercising your right to vote!   Absentee voting is the SAFEST way to vote during the coronavirus pandemic!   Download and print an absentee ballot request form at [email or web address] or you can scan the QR code below with your smart phone:      More information on absentee ballots can be found here:  [email or web address]    Alpha-gal and Red Meat Allergy   Overview An allergy to alpha-gal refers to having a severe and potentially life-threatening allergy to a carbohydrate molecule called galactose-alpha-1,3-galactose that is found in most mammalian or red meat. Unlike other food allergies which typically occur within minutes of ingestion, symptoms from eating red meat such as pork, lamb or beef may be delayed, occurring 3-8 hours after eating. Most food allergies are directed against a protein molecule, but alpha-gal is unusual because it is a carbohydrate, and a delay in its absorption may explain the delay in symptoms.  What are the symptoms of an alpha-gal allergy? As with other food allergies, signs or symptoms of an allergy to alpha-gal may include:  Hives and itching   Swelling of your lips, face or eyelids   Shortness of breath, cough or wheezing   Abdominal pain, nausea, diarrhea or vomiting The most severe reaction, anaphylaxis, can present as a combination of several of these symptoms, may include low blood pressure, and is potentially fatal.  Because these symptoms are delayed, you may only wake up with them in the middle of the night after an evening meal.  How is an alpha-gal allergy diagnosed? Diagnosis of this allergy starts with your allergist taking an appropriate history and physical examination. Because the onset is usually quite delayed, it can be hard to associate the symptoms with eating red meat many hours previously. Triggers include any red meat - including beef, pork, lamb or even horse products. It may occur after eating hotdogs and hamburgers. In very rare cases the reaction may  extend to milk or dairy proteins and gelatin.  Your allergist may recommend testing that includes skin tests to the relevant animal proteins and blood tests which measure the levels of a specific immunoglobulin E (IgE) antibody, to mammalian meats. An investigational  blood test, IgE against alpha-gal itself, may also aid in the diagnosis.  How is an alpha-gal allergy treated? Immediate symptoms such as hives or shortness of breath are treated the same as any other food allergy - in an urgent care setting with anti-histamines, epinephrine and other medications. Prevention long-term involves avoidance of all red meat in sensitized individuals. You may be advised to carry an epinephrine auto-injector, to be used in case of subsequent accidental exposures and reaction. These measures do not necessarily mean switching to a full vegetarian diet, since poultry and fish can be consumed and do not cause similar reactions. As with other food allergies, there is the possibility that over time the sensitivity diminishes - although these changes may take many years to become apparent.  How do you become allergic to alpha-gal? Alpha-gal is a molecule carried in the saliva of the [place] tick and other potential arthropods typically after feeding on mammalian blood. People that are bitten by the tick, especially those that are bitten repeatedly, are at risk of becoming sensitized and producing the IgE necessary to then cause allergic reactions. Interestingly, allergic reactions may occur to red meat, to subsequent tick bites, and even to medications that contain alpha-gal. Cetuximab is a cancer medication that contains alpha-gal, and people who have had allergic reactions to this medication (these are typically immediate reactions, because it is infused intravenously) have a higher risk for red meat allergy and are likely to have been bitten by ticks in the past. As might be expected, the incidence of tick bites is much higher in the [place] and [place] the traditional habitat for the tick. However, cases are now increasingly reported in the [place] and [place] states. And it is a phenomenon that has been observed worldwide, with different ticks responsible for similar cases of  red meat allergy in many other countries such as [place], [place] and [place].  The discovery of this peculiar allergy has allowed researchers to correlate tick bites with many cases of anaphylaxis that would previously have been classified as idiopathic, or of unknown cause. Also, while it was originally thought that the [employer] tick had to feast on mammalian blood in order to carry the alpha-gal molecule, more recent research has shown that it may carry this molecule and be capable of sensitizing humans independently.  How do you prevent an alpha-gal allergy? Because this allergy is predominantly tick born, you are more likely at risk if you often go outdoors in wooded areas for activities such as hiking, fishing or hunting. The key strategy is to prevent tick bites. This may include wearing long sleeved shirts or pants, using appropriate insect repellants, and surveying for ticks after spending time outdoors. Any observed ticks should be removed carefully by cleaning the site with rubbing alcohol, then using tweezers to pull the ticks head up carefully from the skin using steady pressure. Clean your hands and the site one more time and make sure not to crush the tick between your fingers.

## 2019-07-05 NOTE — Progress Notes (Signed)
NEW PATIENT  Date of Service/Encounter:  07/05/19  Referring provider: Ria Bush, MD   Assessment:   Anaphylactic shock due to food  Allergic urticaria - improved with avoidance of mammalian meat  Plan/Recommendations:   1. 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 - Information on alpha gal provided. - We are going to get some labs to look for soy, wheat, milk, and yeast IgE (allergy levels). - We will call you in 1-2 weeks with the results of the testing.  - Anaphylaxis management plan provided. - EpiPen training provided.   2. Return in about 1 year (around 07/04/2020). This can be an in-person, a virtual Webex or a telephone follow up visit.   Subjective:   Douglas [name] is a 48 y.o. male presenting today for evaluation of  Chief Complaint  Patient presents with  . Rash  . Urticaria    Douglas [name] has a history of the following: Patient Active Problem List   Diagnosis Date Noted  . Liver lesion, right lobe 06/14/2019  . Hx of adenomatous colonic polyps 06/14/2019  . Allergy to alpha-gal 06/13/2019  . LLQ abdominal pain 05/18/2019  . Heartburn 05/18/2019  . Dyslipidemia   . Healthcare maintenance 07/12/2011  . ALLERGIC RHINITIS 02/12/2008    History obtained from: chart review and patient.  Douglas [name] was referred by Douglas Bush, MD.     Douglas [name] is a 48 y.o. male presenting for an evaluation of allergic reactions. He was being evaluated for a positive FOBT by GI when he mention developing hives after eating pork and beef, which prompted them to check for alpha-gal; he was referred and provided with an EpiPen when his test returned positive. He had an alpha-gal panel ordered by gastroenterology returned positive.     He states that he first had symptoms in 2003 after he was bitten by a tick (he like to hunt deer and spends time in the woods). At the time, he was living in [place] and saw and allergist there who did  scratch testing, which he believes may have been positive for cockroach and he was also told he was allergic to red meat. He received 1 year of allergy shots for unknown allergen, which he states did not change his symptoms. He then moved to [place] and did not follow up with an allergist there. He continued (and continues still) to have rashes after eating beef and pork. While in [place] he went the ED a couple of time for the rash and he received some medication, which helped. He has never experienced SOB or throat fullness. He now takes PRN Claritin for a few days when he gets the rash until it goes away. He tries to eat other meats but enjoys the taste of beef and pork.  He reports that he may get a rash with soy sauce as well. He does have itching with cow's milk, but not with yogurt or cheese. He does sometime have issues with bread, especially fresh baked bread, and thinks this might have something to do with yeast. He has never been tested for these, to his knowledge. He denies allergic rhinitis or other allergy symptoms to indoor or outdoor allergens. He also denies seasonal allergy symptoms. He has no history of asthma nor eczema.  Otherwise, there is no history of other atopic diseases, including asthma, drug allergies, stinging insect allergies, eczema, urticaria or contact dermatitis. There is no significant infectious history. Vaccinations are up to  date.    Past Medical History: Patient Active Problem List   Diagnosis Date Noted  . Liver lesion, right lobe 06/14/2019  . Hx of adenomatous colonic polyps 06/14/2019  . Allergy to alpha-gal 06/13/2019  . LLQ abdominal pain 05/18/2019  . Heartburn 05/18/2019  . Dyslipidemia   . Healthcare maintenance 07/12/2011  . ALLERGIC RHINITIS 02/12/2008    Medication List:  Allergies as of 07/05/2019      Reactions   Alpha-gal Rash      Medication List       Accurate as of July 05, 2019  9:36 AM. If you have any questions, ask your  nurse or doctor.        dicyclomine 20 MG tablet Commonly known as: BENTYL Take 20 mg by mouth every 6 (six) hours. What changed: Another medication with the same name was removed. Continue taking this medication, and follow the directions you see here. Changed by: Douglas Shaggy, MD   EPINEPHrine 0.3 mg/0.3 mL Soaj injection Commonly known as: EPI-PEN Inject 0.3 mLs (0.3 mg total) into the muscle as needed for anaphylaxis (alph-gal allergy).   loratadine 10 MG tablet Commonly known as: CLARITIN Take 10 mg by mouth daily.   naproxen sodium 220 MG tablet Commonly known as: ALEVE Take 220 mg by mouth as needed (for back pain).       Birth History: non-contributory  Developmental History: non-contributory  Past Surgical History: Past Surgical History:  Procedure Laterality Date  . COLONOSCOPY  05/2019   2 diminutive TA, diverticulosis, negative biopsies, rpt 7 yrs Douglas [name])  . NASAL SINUS SURGERY  1994     Family History: Family History  Problem Relation Age of Onset  . Kidney disease Mother        ESRD-Dialysis from too much tylenol  . Leukemia Father 17  . Diabetes Neg Hx   . Coronary artery disease Neg Hx   . Stroke Neg Hx   . Colon cancer Neg Hx      Social History: Douglas [name] lives at home with family in a house that was built in 1964.  There is carpeting in the main living areas and hardwood floors in the bedroom.  They have gas heating and central cooling.  There are chickens outside of the house.  He does have dust mite covers on his bedding.  There is no tobacco exposure.  He works as a Hospital doctor for the past 5 years..    Review of Systems  Constitutional: Negative.  Negative for chills, fever, malaise/fatigue and weight loss.  HENT: Negative.  Negative for congestion, ear discharge, ear pain, hearing loss, sinus pain and sore throat.   Eyes: Negative for pain, discharge and redness.  Respiratory: Negative for cough, sputum  production, shortness of breath and wheezing.   Cardiovascular: Negative.  Negative for chest pain and palpitations.  Gastrointestinal: Negative for abdominal pain, constipation, diarrhea, heartburn, nausea and vomiting.  Skin: Negative.  Negative for itching and rash.  Neurological: Negative for dizziness and headaches.  Endo/Heme/Allergies: Negative for environmental allergies. Does not bruise/bleed easily.       Objective:   Blood pressure 110/62, pulse 61, temperature 97.7 F (36.5 C), temperature source Temporal, resp. rate 18, height 5' 2.5" (1.588 m), weight 131 lb (59.4 kg), SpO2 96 %. Body mass index is 23.58 kg/m.   Physical Exam:   Physical Exam  Constitutional: He appears well-developed.  Talkative.   HENT:  Head: Normocephalic and atraumatic.  Right Ear: Tympanic membrane, external  ear and ear canal normal. No drainage, swelling or tenderness. Tympanic membrane is not injected, not scarred, not erythematous, not retracted and not bulging.  Left Ear: Tympanic membrane, external ear and ear canal normal. No drainage, swelling or tenderness. Tympanic membrane is not injected, not scarred, not erythematous, not retracted and not bulging.  Nose: No mucosal edema, rhinorrhea, nasal deformity or septal deviation. No epistaxis. Right sinus exhibits no maxillary sinus tenderness and no frontal sinus tenderness. Left sinus exhibits no maxillary sinus tenderness and no frontal sinus tenderness.  Mouth/Throat: Uvula is midline and oropharynx is clear and moist. Mucous membranes are not pale and not dry.  No cobblestoning in the posterior oropharynx.   Eyes: Pupils are equal, round, and reactive to light. Conjunctivae and EOM are normal. Right eye exhibits no chemosis and no discharge. Left eye exhibits no chemosis and no discharge. Right conjunctiva is not injected. Left conjunctiva is not injected.  Cardiovascular: Normal rate, regular rhythm and normal heart sounds.  Respiratory:  Effort normal and breath sounds normal. No accessory muscle usage. No tachypnea. No respiratory distress. He has no wheezes. He has no rhonchi. He has no rales. He exhibits no tenderness.  No wheezes or crackles.   GI: There is no abdominal tenderness. There is no rebound and no guarding.  Lymphadenopathy:       Head (right side): No submandibular, no tonsillar and no occipital adenopathy present.       Head (left side): No submandibular, no tonsillar and no occipital adenopathy present.    He has no cervical adenopathy.  Neurological: He is alert.  Skin: No abrasion, no petechiae and no rash noted. Rash is not papular, not vesicular and not urticarial. No erythema. No pallor.  No eczematous lesions or urticaria noted.  Psychiatric: He has a normal mood and affect.     Diagnostic studies: labs sent instead      Salvatore Marvel, MD [facility]

## 2019-07-08 LAB — MILK COMPONENT PANEL
F076-IgE Alpha Lactalbumin: <0.1 kU/L
F077-IgE Beta Lactoglobulin: <0.1 kU/L
F078-IgE Casein: <0.1 kU/L

## 2019-07-08 LAB — ALLERGEN SOYBEAN: Soybean IgE: <0.1 kU/L

## 2019-07-08 LAB — TRYPTASE: Tryptase: 2.7 ug/L (ref 2.2–13.2)

## 2019-07-08 LAB — ALLERGEN, WHEAT, F4: Wheat IgE: 0.12 kU/L — AB

## 2019-07-08 LAB — ALLERGEN, BAKERS YEAST, F45: F045-IgE Yeast: <0.1 kU/L

## 2019-07-11 ENCOUNTER — Encounter: Payer: Self-pay | Admitting: Allergy & Immunology

## 2019-07-25 ENCOUNTER — Telehealth: Payer: Self-pay

## 2019-07-25 NOTE — Telephone Encounter (Signed)
Refill and have him set up a f/u visit next available - give him enough to last until then

## 2019-07-25 NOTE — Telephone Encounter (Signed)
I spoke with him and he has plenty of the medicine so the pharmacy must have generated the request. I did set him up an appointment.

## 2019-07-25 NOTE — Telephone Encounter (Signed)
May I refill his dicyclomine Sir? Thank you.

## 2019-08-16 ENCOUNTER — Encounter: Payer: Self-pay | Admitting: Internal Medicine

## 2019-08-16 ENCOUNTER — Ambulatory Visit (INDEPENDENT_AMBULATORY_CARE_PROVIDER_SITE_OTHER): Payer: 59 | Admitting: Internal Medicine

## 2019-08-16 ENCOUNTER — Other Ambulatory Visit: Payer: Self-pay

## 2019-08-16 VITALS — BP 112/72 | HR 66 | Temp 97.5°F | Ht 63.0 in | Wt 131.1 lb

## 2019-08-16 DIAGNOSIS — K5730 Diverticulosis of large intestine without perforation or abscess without bleeding: Secondary | ICD-10-CM | POA: Diagnosis not present

## 2019-08-16 DIAGNOSIS — R10814 Left lower quadrant abdominal tenderness: Secondary | ICD-10-CM

## 2019-08-16 DIAGNOSIS — K529 Noninfective gastroenteritis and colitis, unspecified: Secondary | ICD-10-CM

## 2019-08-16 DIAGNOSIS — R1032 Left lower quadrant pain: Secondary | ICD-10-CM | POA: Diagnosis not present

## 2019-08-16 MED ORDER — AMOXICILLIN-POT CLAVULANATE 875-125 MG PO TABS: 1.0000 | ORAL_TABLET | Freq: Two times a day (BID) | ORAL | 0 refills | Status: DC

## 2019-08-16 NOTE — Progress Notes (Signed)
Douglas [name] 48 y.o. 1971/04/24 [id number]  Assessment & Plan:   Encounter Diagnoses  Name Primary?  Marland Kitchen LLQ pain Yes  . Left lower quadrant abdominal tenderness without rebound tenderness   . Colitis   . Diverticulosis of colon without hemorrhage      I do not have a clear diagnosis at this point.  He could have symptomatic diverticulosis, he could have a post colitis IBS, perhaps he does have occult diverticulitis.  He is a small man and I can palpate his sigmoid colon but he is a little tender there.  I am going to treat with Augmentin 875 mg generic twice daily for 10 days.  He is to message me by my chart with a follow-up.  Continue as needed dicyclomine  I appreciate the opportunity to care for this patient. CC: Ria Bush, MD   Subjective:   Chief Complaint: Left lower quadrant pain  HPI Patient is here for follow-up, he had a colonoscopy recently after a CT scan demonstrated some question of colitis in the left colon.  I found some erythematous slightly abnormal mucosa in the left colon took biopsies but there was nonspecific colitis thought to be an acute resolving colitis.  Thought to be self-limited.  Also had 2 diminutive adenomas.  He has seen allergy regarding his allergy to alpha gal and has EpiPen's now.  He is doing okay but he continues to use dicyclomine intermittently several times a week.  He was on some metronidazole before but had difficulty taking it and did not complete it but thought that that was helping him.  There was no evidence of discrete diverticulitis on imaging or colonoscopy.  Spicy or salty foods tend to aggravate things.  Spicy Salty   Worse  Wt Readings from Last 3 Encounters:  08/16/19 131 lb 2 oz (59.5 kg)  07/05/19 131 lb (59.4 kg)  06/08/19 133 lb (60.3 kg)    Allergies  Allergen Reactions  . Alpha-Gal Rash   Current Meds  Medication Sig  . dicyclomine (BENTYL) 20 MG tablet Take 1 tablet (20 mg total) by mouth every 6  (six) hours as needed for spasms.  Marland Kitchen EPINEPHrine 0.3 mg/0.3 mL IJ SOAJ injection Inject 0.3 mLs (0.3 mg total) into the muscle as needed for anaphylaxis (alph-gal allergy).  Marland Kitchen loratadine (CLARITIN) 10 MG tablet Take 10 mg by mouth daily.    . [DISCONTINUED] naproxen sodium (ALEVE) 220 MG tablet Take 220 mg by mouth as needed (for back pain).   Past Medical History:  Diagnosis Date  . Allergy to alpha-gal 06/13/2019  . Concussion   . Dyslipidemia   . Epistaxis   . GERD (gastroesophageal reflux disease)   . Headache(784.0)   . Hepatic hemangioma 06/2019  . Hx of adenomatous colonic polyps 06/14/2019   05/2019 2 diminutive adenomas recall 2027  . Seasonal allergic rhinitis    worse in spring and fall  . Sinusitis, chronic   . Urticaria    Past Surgical History:  Procedure Laterality Date  . COLONOSCOPY  05/2019   2 diminutive TA, diverticulosis, negative biopsies, rpt 7 yrs Carlean Purl)  . 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   Review of Systems As per HPI no fevers has not been ill otherwise  Objective:   Physical Exam BP 112/72 (BP Location: Left Arm, Patient Position: Sitting, Cuff Size: Normal)   Pulse 66   Temp (!) 97.5 F (36.4 C) (Other (Comment))   Ht 5' 3"  (1.6 m)   Wt 131 lb 2 oz (59.5 kg)   BMI 23.23 kg/m  Well-developed well-nourished [place] Asian man in no acute distress Eyes are anicteric The abdomen is thin soft of the sigmoid colon is palpable and slightly tender to moderately tender.  No masses felt. Appropriate  mood and affect

## 2019-08-16 NOTE — Patient Instructions (Signed)
We are giving you a handout to read on diverticulosis and diverticulitis. Dr Carlean Purl thinks you may have diverticulitis.    He is going to treat you with antibiotics.    Please up date [place] in 2-3 weeks via [place] with how your doing.   I appreciate the opportunity to care for you. Silvano Rusk, MD, [facility]

## 2019-08-28 ENCOUNTER — Telehealth: Payer: Self-pay | Admitting: Internal Medicine

## 2019-08-28 NOTE — Telephone Encounter (Signed)
Please advise Sir,  Thank you.

## 2019-08-28 NOTE — Telephone Encounter (Signed)
Stop it then  Is he still having abdominal pain?

## 2019-08-28 NOTE — Telephone Encounter (Signed)
Spoke with Douglas [name] and he said his head is better than it was. He's been drinking lots of water. No nausea or vomiting, no diarrhea with the antibiotic just a horrible headache. He is only having abdominal pain here and there he states but not too bad. I told him to stop the antibiotic and I will see what Dr Carlean Purl advises.

## 2019-08-29 NOTE — Telephone Encounter (Signed)
He is doing better today and he will just see how he does and call us back to set up appointment as needed he stated.

## 2019-08-29 NOTE — Telephone Encounter (Signed)
Observe and f/u next available if desired vs prn  I think he can see how he does and contact us if abdominal pain is problematic again

## 2019-09-15 ENCOUNTER — Encounter: Payer: Self-pay | Admitting: Family Medicine

## 2019-10-25 ENCOUNTER — Encounter: Payer: Self-pay | Admitting: Family Medicine

## 2019-12-11 IMAGING — MR MR ABDOMEN WO/W CM
10 of 19 series · 21 of 48 positions shown · IV contrast (gadavist)
Comparison: 06/01/2019

CLINICAL DATA: Evaluate liver lesion.  Possible hemangioma.

EXAM:
MRI ABDOMEN WITHOUT AND WITH CONTRAST
TECHNIQUE: Multiplanar multisequence MR imaging of the abdomen was performed
both before and after the administration of intravenous contrast.
CONTRAST:  6 cc Gadavist

[Series 3: T2 fat-sat · axial · 5.0mm · 0.78mm/px · z∈[-111,+129]mm · 2 of 49 slices shown (1 of 2)]
[im 1/49]
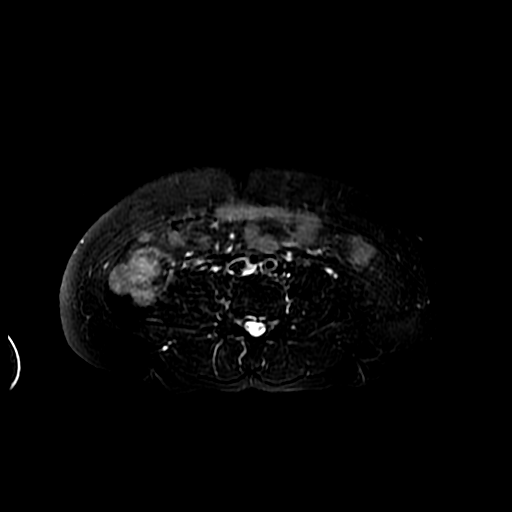
[im 49/49]
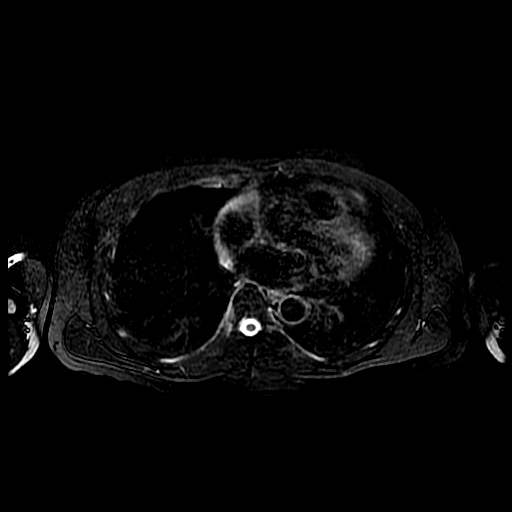

[Series 4: DWI b500 · axial · 6.0mm · 1.48mm/px · z∈[-123,+135]mm · 2 of 68 slices shown]
[im 1/68]
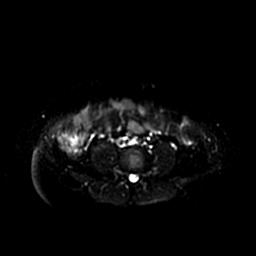
[im 68/68]
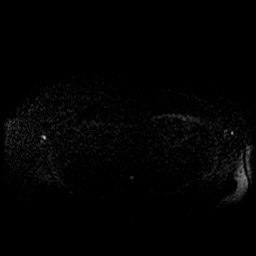

[Series 5: T2 · axial · 5.0mm · 0.78mm/px · z∈[-111,+129]mm · 2 of 49 slices shown (1 of 2)]
[im 1/49]
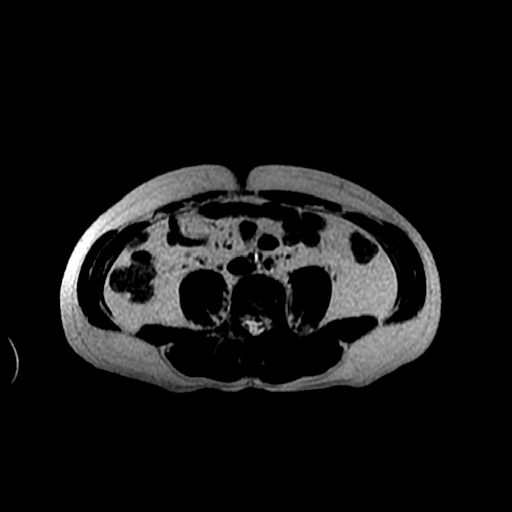
[im 49/49]
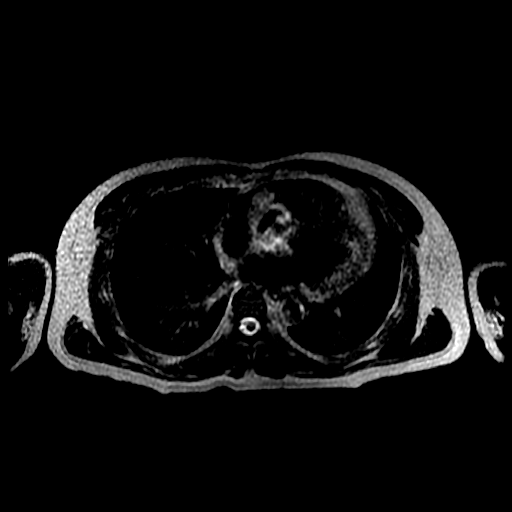

[Series 6: T2 fat-sat · axial · 5.0mm · 0.78mm/px · z∈[-111,+129]mm · 2 of 49 slices shown (2 of 2)]
[im 1/49]
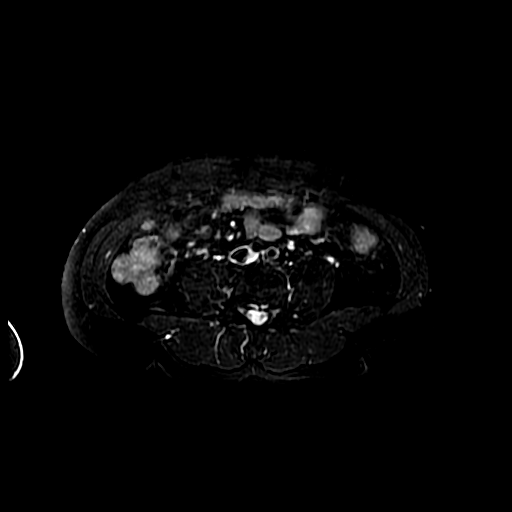
[im 49/49]
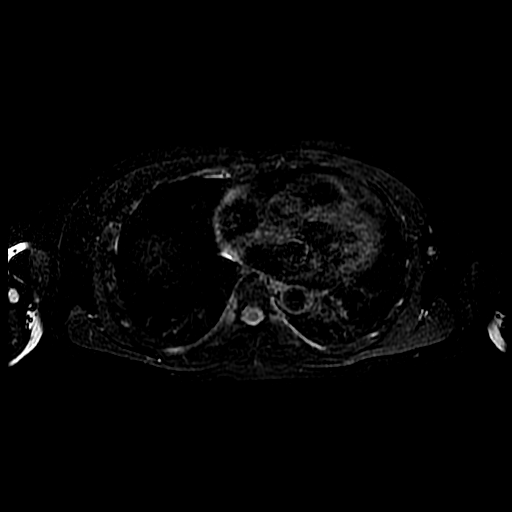

[Series 7: T2 · coronal · 5.0mm · 0.78mm/px · 1 of 36 slices shown (2 of 2)]
[im 1/36]
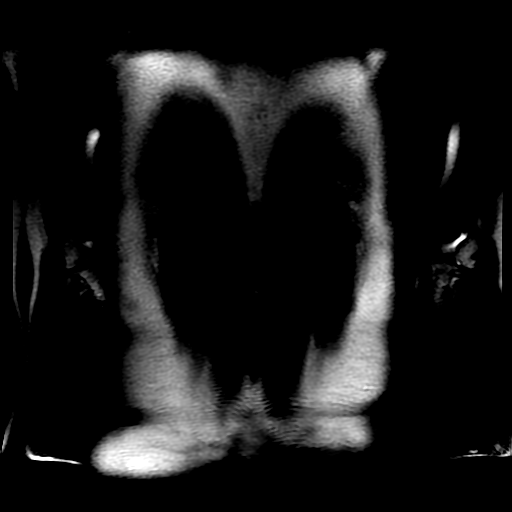

[Series 8: bSSFP · axial · 5.0mm · 0.78mm/px · z∈[-111,+129]mm · 2 of 49 slices shown]
[im 1/49]
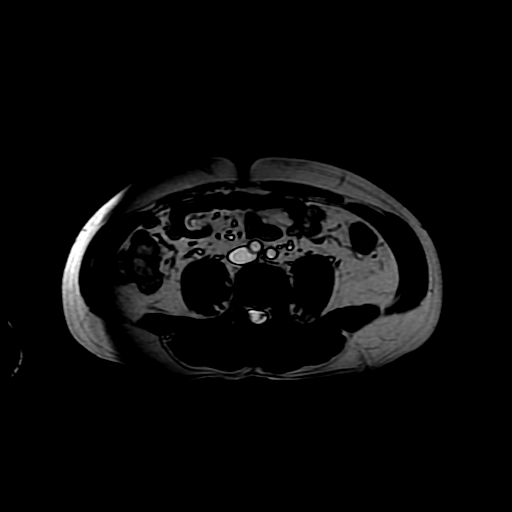
[im 49/49]
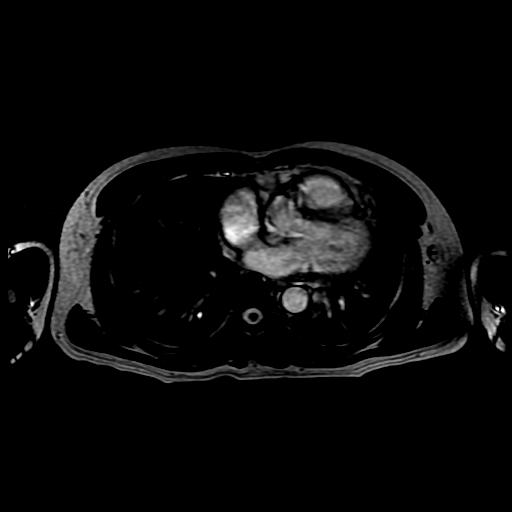

[Series 9: ax dualecho bh · axial · 5.0mm · 0.78mm/px · z∈[-111,+129]mm · 3 of 98 slices shown]
[im 1/98]
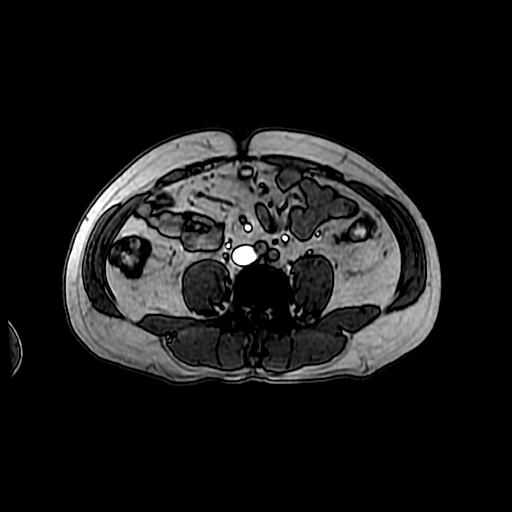
[im 49/98]
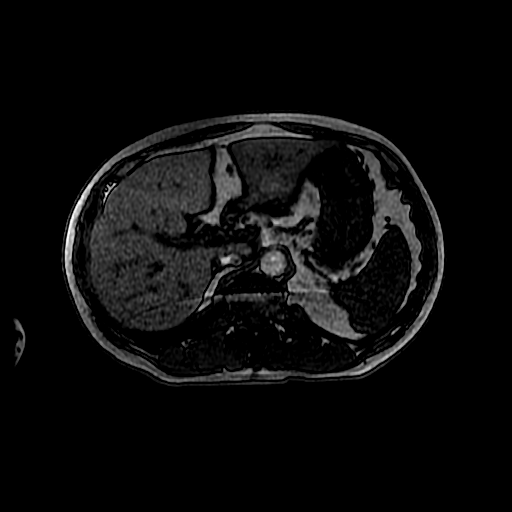
[im 98/98]
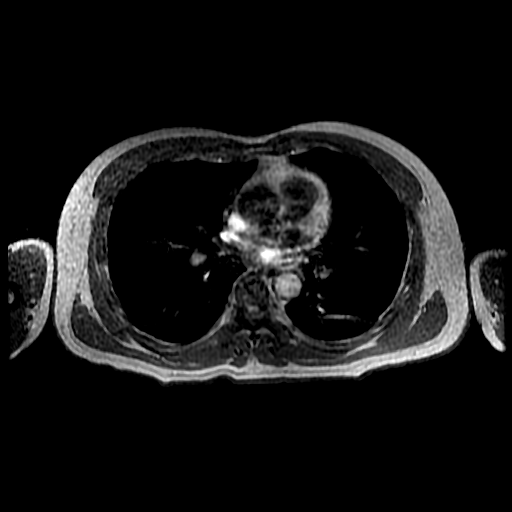

[Series 400: DWI · axial · 6.0mm · 1.48mm/px · 1 of 34 slices shown]
[im 1/34]
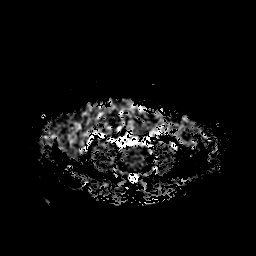

[Series 1000: T1 dynamic · axial · 5.0mm · 0.78mm/px · z∈[-112,+126]mm · 3 of 96 slices shown (1 of 2)]
[im 1/96]
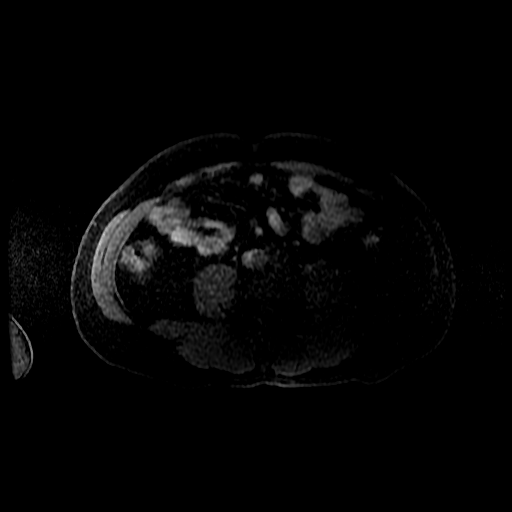
[im 48/96]
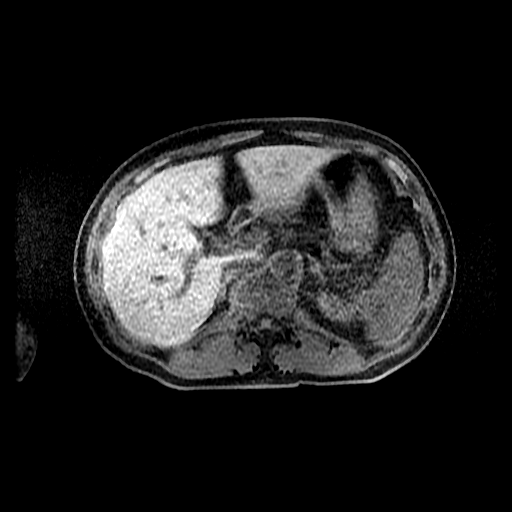
[im 96/96]
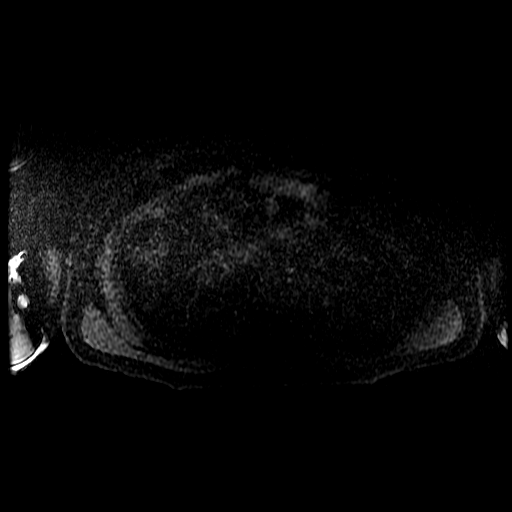

[Series 1001: T1 dynamic · axial · 5.0mm · 0.78mm/px · z∈[-112,+126]mm · 3 of 96 slices shown (2 of 2)]
[im 1/96]
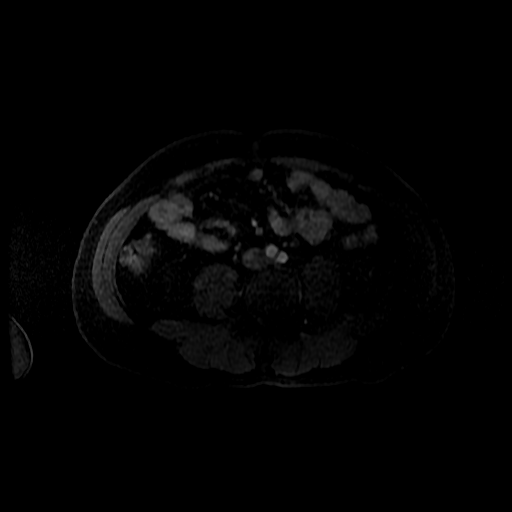
[im 48/96]
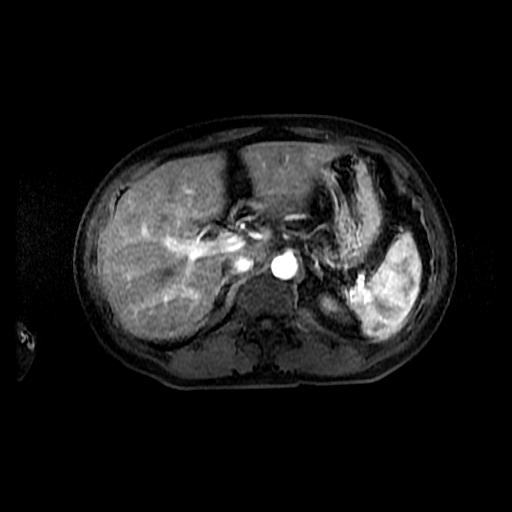
[im 96/96]
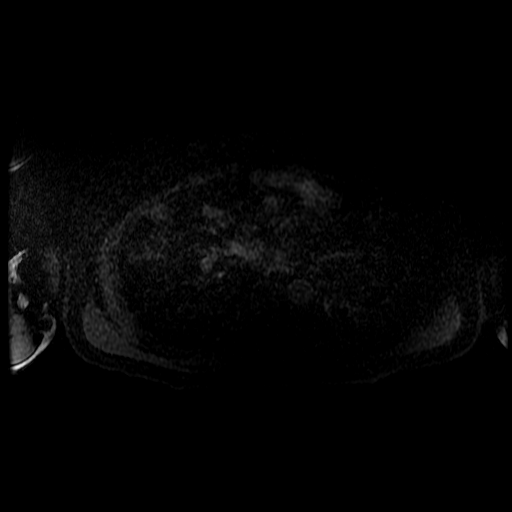

[21 of 48 positions shown; findings below may reference images not displayed]

FINDINGS: Lower chest: No acute findings.

Hepatobiliary: Within the posterior aspect of segment 8 there is a
2.3 cm T2 hyperintense and T1 hypointense structure, image [DATE]. This
demonstrates early peripheral nodular enhancement with delayed
contrast fill-in compatible with a benign liver hemangioma. The no
additional focal liver abnormality identified. Gallbladder normal.
No biliary dilatation

Pancreas: No mass, inflammatory changes, or other parenchymal
abnormality identified.

Spleen:  Within normal limits in size and appearance.

Adrenals/Urinary Tract: Normal adrenal glands. The kidneys are
unremarkable. No mass or hydronephrosis.

Stomach/Bowel: Visualized portions within the abdomen are
unremarkable.

Vascular/Lymphatic: No pathologically enlarged lymph nodes
identified. No abdominal aortic aneurysm demonstrated.

Other:  None.

Musculoskeletal: No suspicious bone lesions identified.
IMPRESSION: 1. Benign posterior right lobe of liver hemangioma corresponds to
the recently identified CT abnormality within the liver.

## 2020-07-28 ENCOUNTER — Other Ambulatory Visit: Payer: Self-pay | Admitting: Family Medicine

## 2020-07-28 DIAGNOSIS — E785 Hyperlipidemia, unspecified: Secondary | ICD-10-CM

## 2020-08-01 ENCOUNTER — Other Ambulatory Visit (INDEPENDENT_AMBULATORY_CARE_PROVIDER_SITE_OTHER): Payer: 59

## 2020-08-01 ENCOUNTER — Other Ambulatory Visit: Payer: Self-pay

## 2020-08-01 DIAGNOSIS — E785 Hyperlipidemia, unspecified: Secondary | ICD-10-CM | POA: Diagnosis not present

## 2020-08-01 LAB — COMPREHENSIVE METABOLIC PANEL
ALT: 19 U/L (ref 0–53)
AST: 23 U/L (ref 0–37)
Albumin: 4.5 g/dL (ref 3.5–5.2)
Alkaline Phosphatase: 77 U/L (ref 39–117)
BUN: 20 mg/dL (ref 6–23)
CO2: 29 mEq/L (ref 19–32)
Calcium: 9.2 mg/dL (ref 8.4–10.5)
Chloride: 103 mEq/L (ref 96–112)
Creatinine, Ser: 0.91 mg/dL (ref 0.40–1.50)
GFR: 98.06 mL/min (ref >60.00)
Glucose, Bld: 94 mg/dL (ref 70–99)
Potassium: 4.2 mEq/L (ref 3.5–5.1)
Sodium: 140 mEq/L (ref 135–145)
Total Bilirubin: 1 mg/dL (ref 0.2–1.2)
Total Protein: 7.2 g/dL (ref 6.0–8.3)

## 2020-08-01 LAB — LIPID PANEL
Cholesterol: 183 mg/dL (ref 0–200)
HDL: 43.1 mg/dL (ref >39.00)
NonHDL: 139.42
Total CHOL/HDL Ratio: 4
Triglycerides: 233 mg/dL — ABNORMAL HIGH (ref 0.0–149.0)
VLDL: 46.6 mg/dL — ABNORMAL HIGH (ref 0.0–40.0)

## 2020-08-01 LAB — LDL CHOLESTEROL, DIRECT: Direct LDL: 82 mg/dL

## 2020-08-01 LAB — TSH: TSH: 1.77 u[IU]/mL (ref 0.35–4.50)

## 2020-08-06 ENCOUNTER — Ambulatory Visit (INDEPENDENT_AMBULATORY_CARE_PROVIDER_SITE_OTHER): Payer: 59 | Admitting: Family Medicine

## 2020-08-06 ENCOUNTER — Other Ambulatory Visit: Payer: Self-pay

## 2020-08-06 ENCOUNTER — Encounter: Payer: Self-pay | Admitting: Family Medicine

## 2020-08-06 VITALS — BP 120/70 | HR 58 | Temp 97.5°F | Ht 62.5 in | Wt 133.4 lb

## 2020-08-06 DIAGNOSIS — Z23 Encounter for immunization: Secondary | ICD-10-CM | POA: Diagnosis not present

## 2020-08-06 DIAGNOSIS — Z91018 Allergy to other foods: Secondary | ICD-10-CM

## 2020-08-06 DIAGNOSIS — Z0000 Encounter for general adult medical examination without abnormal findings: Secondary | ICD-10-CM | POA: Diagnosis not present

## 2020-08-06 DIAGNOSIS — E785 Hyperlipidemia, unspecified: Secondary | ICD-10-CM | POA: Diagnosis not present

## 2020-08-06 MED ORDER — SCOPOLAMINE 1 MG/3DAYS TD PT72: 1.0000 | MEDICATED_PATCH | TRANSDERMAL | 1 refills | Status: DC

## 2020-08-06 NOTE — Progress Notes (Signed)
This visit was conducted in person.  BP 120/70 (BP Location: Left Arm, Patient Position: Sitting, Cuff Size: Normal)   Pulse (!) 58   Temp (!) 97.5 F (36.4 C) (Temporal)   Ht 5' 2.5" (1.588 m)   Wt 133 lb 7 oz (60.5 kg)   SpO2 98%   BMI 24.02 kg/m    CC: CPE Subjective:    Patient ID: Douglas [name], male    DOB: 1971/10/04, 49 y.o.   MRN: [id number]  HPI: Douglas [name] is a 49 y.o. male presenting on 08/06/2020 for Annual Exam   Recent deep sea fishing trip over weekend. Planned rpt trip. Requests scopolamine patch.   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  LLQ abd pain - saw GI last year, colonoscopy stable with resolving likely self limited colitis. Still with intermittent LLQ twinges.   Preventative: COLONOSCOPY 05/2019 - 2 diminutive TA, diverticulosis, negative biopsies, rpt 7 yrs Carlean Purl) Prostate screening - no fmhx. No prostate symptoms.  Flu shot yearly Td 2011 - declines today [place] 05/2020, 06/2020  Seat belt use discussed  Sunscreen use discussed. No changing moles on skin  Dentist due  Eye exam due Non smoker Alcohol - none  Caffeine: 1 cup coffee/day Married, lives with wife and son (adopted), no pets Occupation: Social research officer, government Activity: walking at [employer] park regularly, enjoys tennis Diet: fruits and vegetables daily, good water, red meat 1x/wk, fish 1x/wk     Relevant past medical, surgical, family and social history reviewed and updated as indicated. Interim medical history since our last visit reviewed. Allergies and medications reviewed and updated. Outpatient Medications Prior to Visit  Medication Sig Dispense Refill  . EPINEPHrine 0.3 mg/0.3 mL IJ SOAJ injection Inject 0.3 mLs (0.3 mg total) into the muscle as needed for anaphylaxis (alph-gal allergy). 1 each 0  . amoxicillin-clavulanate (AUGMENTIN) 875-125 MG tablet Take 1 tablet by mouth 2 (two) times daily. 20 tablet 0  . dicyclomine (BENTYL) 20 MG tablet Take 1 tablet (20  mg total) by mouth every 6 (six) hours as needed for spasms. 90 tablet 0  . loratadine (CLARITIN) 10 MG tablet Take 10 mg by mouth daily.       No facility-administered medications prior to visit.     Per HPI unless specifically indicated in ROS section below Review of Systems  Constitutional: Negative for activity change, appetite change, chills, fatigue, fever and unexpected weight change.  HENT: Negative for hearing loss.   Eyes: Negative for visual disturbance.  Respiratory: Negative for cough, chest tightness, shortness of breath and wheezing.   Cardiovascular: Negative for chest pain, palpitations and leg swelling.  Gastrointestinal: Negative for abdominal distention, abdominal pain, blood in stool, constipation, diarrhea, nausea and vomiting.  Genitourinary: Negative for difficulty urinating and hematuria.  Musculoskeletal: Negative for arthralgias, myalgias and neck pain.  Skin: Negative for rash.  Neurological: Negative for dizziness, seizures, syncope and headaches.  Hematological: Negative for adenopathy. Does not bruise/bleed easily.  Psychiatric/Behavioral: Negative for dysphoric mood. The patient is not nervous/anxious.    Objective:  BP 120/70 (BP Location: Left Arm, Patient Position: Sitting, Cuff Size: Normal)   Pulse (!) 58   Temp (!) 97.5 F (36.4 C) (Temporal)   Ht 5' 2.5" (1.588 m)   Wt 133 lb 7 oz (60.5 kg)   SpO2 98%   BMI 24.02 kg/m   Wt Readings from Last 3 Encounters:  08/06/20 133 lb 7 oz (60.5 kg)  08/16/19 131 lb 2 oz (  59.5 kg)  07/05/19 131 lb (59.4 kg)      Physical Exam Vitals and nursing note reviewed.  Constitutional:      General: He is not in acute distress.    Appearance: Normal appearance. He is well-developed. He is not ill-appearing.  HENT:     Head: Normocephalic and atraumatic.     Right Ear: Hearing, tympanic membrane, ear canal and external ear normal.     Left Ear: Hearing, tympanic membrane, ear canal and external ear normal.    Eyes:     General: No scleral icterus.    Extraocular Movements: Extraocular movements intact.     Conjunctiva/sclera: Conjunctivae normal.     Pupils: Pupils are equal, round, and reactive to light.  Neck:     Thyroid: No thyroid mass or thyromegaly.  Cardiovascular:     Rate and Rhythm: Normal rate and regular rhythm.     Pulses: Normal pulses.          Radial pulses are 2+ on the right side and 2+ on the left side.     Heart sounds: Normal heart sounds. No murmur heard.   Pulmonary:     Effort: Pulmonary effort is normal. No respiratory distress.     Breath sounds: Normal breath sounds. No wheezing, rhonchi or rales.  Abdominal:     General: Abdomen is flat. Bowel sounds are normal. There is no distension.     Palpations: Abdomen is soft. There is no mass.     Tenderness: There is no abdominal tenderness. There is no guarding or rebound.     Hernia: No hernia is present.  Musculoskeletal:        General: Normal range of motion.     Cervical back: Normal range of motion and neck supple.     Right lower leg: No edema.     Left lower leg: No edema.  Lymphadenopathy:     Cervical: No cervical adenopathy.  Skin:    General: Skin is warm and dry.     Findings: No rash.  Neurological:     General: No focal deficit present.     Mental Status: He is alert and oriented to person, place, and time.     Comments: CN grossly intact, station and gait intact  Psychiatric:        Mood and Affect: Mood normal.        Behavior: Behavior normal.        Thought Content: Thought content normal.        Judgment: Judgment normal.       Results for orders placed or performed in visit on 08/01/20  Lipid panel  Result Value Ref Range   Cholesterol 183 0 - 200 mg/dL   Triglycerides 233.0 (H) 0 - 149 mg/dL   HDL 43.10 >39.00 mg/dL   VLDL 46.6 (H) 0.0 - 40.0 mg/dL   Total CHOL/HDL Ratio 4    NonHDL 139.42   Comprehensive metabolic panel  Result Value Ref Range   Sodium 140 135 - 145  mEq/L   Potassium 4.2 3.5 - 5.1 mEq/L   Chloride 103 96 - 112 mEq/L   CO2 29 19 - 32 mEq/L   Glucose, Bld 94 70 - 99 mg/dL   BUN 20 6 - 23 mg/dL   Creatinine, Ser 0.91 0.40 - 1.50 mg/dL   Total Bilirubin 1.0 0.2 - 1.2 mg/dL   Alkaline Phosphatase 77 39 - 117 U/L   AST 23 0 - 37 U/L  ALT 19 0 - 53 U/L   Total Protein 7.2 6.0 - 8.3 g/dL   Albumin 4.5 3.5 - 5.2 g/dL   GFR 98.06 >60.00 mL/min   Calcium 9.2 8.4 - 10.5 mg/dL  TSH  Result Value Ref Range   TSH 1.77 0.35 - 4.50 uIU/mL  LDL cholesterol, direct  Result Value Ref Range   Direct LDL 82.0 mg/dL   Assessment & Plan:  This visit occurred during the SARS-CoV-2 public health emergency.  Safety protocols were in place, including screening questions prior to the visit, additional usage of staff PPE, and extensive cleaning of exam room while observing appropriate contact time as indicated for disinfecting solutions.   Problem List Items Addressed This Visit    Healthcare maintenance - Primary    Preventative protocols reviewed and updated unless pt declined. Discussed healthy diet and lifestyle.       Dyslipidemia    Predominant hypertriglyceridemia. Reviewed diet choices to improve levels.  The 10-year ASCVD risk score Mikey Bussing DC Brooke Bonito., et al., 2013) is: 2.9%   Values used to calculate the score:     Age: 73 years     Sex: Male     Is Non-Hispanic African American: No     Diabetic: No     Tobacco smoker: No     Systolic Blood Pressure: 826 mmHg     Is BP treated: No     HDL Cholesterol: 43.1 mg/dL     Total Cholesterol: 183 mg/dL       Allergy to alpha-gal    Avoiding red meat/beef with benefit       Other Visit Diagnoses    Need for influenza vaccination       Relevant Orders   Flu Vaccine QUAD 36+ mos IM (Completed)       Meds ordered this encounter  Medications  . scopolamine (TRANSDERM-SCOP, 1.5 MG,) 1 MG/3DAYS    Sig: Place 1 patch (1.5 mg total) onto the skin every 3 (three) days.    Dispense:  4 patch     Refill:  1   Orders Placed This Encounter  Procedures  . Flu Vaccine QUAD 36+ mos IM    Patient instructions: Flu shot today  Blood work looked ok today , but triglycerides were high - Decrease added sugars, eliminate trans fats, increase fiber and limit alcohol. Increase fatty fish (salmon, tuna, trout, etc) in the diet - these fish are rich in omega three fatty acids. All these changes together can drop triglycerides by almost 50%.  Return as needed or in 1 year for next physical   Follow up plan: Return in about 1 year (around 08/06/2021) for annual exam, prior fasting for blood work.  Ria Bush, MD

## 2020-08-06 NOTE — Assessment & Plan Note (Signed)
Predominant hypertriglyceridemia. Reviewed diet choices to improve levels.  The 10-year ASCVD risk score Mikey Bussing DC Brooke Bonito., et al., 2013) is: 2.9%   Values used to calculate the score:     Age: 49 years     Sex: Male     Is Non-Hispanic African American: No     Diabetic: No     Tobacco smoker: No     Systolic Blood Pressure: 580 mmHg     Is BP treated: No     HDL Cholesterol: 43.1 mg/dL     Total Cholesterol: 183 mg/dL

## 2020-08-06 NOTE — Assessment & Plan Note (Signed)
Preventative protocols reviewed and updated unless pt declined. Discussed healthy diet and lifestyle.  

## 2020-08-06 NOTE — Assessment & Plan Note (Addendum)
Avoiding red meat/beef with benefit

## 2020-08-06 NOTE — Patient Instructions (Addendum)
Flu shot today  Blood work looked ok today , but triglycerides were high - Decrease added sugars, eliminate trans fats, increase fiber and limit alcohol. Increase fatty fish (salmon, tuna, trout, etc) in the diet - these fish are rich in omega three fatty acids. All these changes together can drop triglycerides by almost 50%.  Return as needed or in 1 year for next physical   Health Maintenance, Male Adopting a healthy lifestyle and getting preventive care are important in promoting health and wellness. Ask your health care provider about:  The right schedule for you to have regular tests and exams.  Things you can do on your own to prevent diseases and keep yourself healthy. What should I know about diet, weight, and exercise? Eat a healthy diet   Eat a diet that includes plenty of vegetables, fruits, low-fat dairy products, and lean protein.  Do not eat a lot of foods that are high in solid fats, added sugars, or sodium. Maintain a healthy weight Body mass index (BMI) is a measurement that can be used to identify possible weight problems. It estimates body fat based on height and weight. Your health care provider can help determine your BMI and help you achieve or maintain a healthy weight. Get regular exercise Get regular exercise. This is one of the most important things you can do for your health. Most adults should:  Exercise for at least 150 minutes each week. The exercise should increase your heart rate and make you sweat (moderate-intensity exercise).  Do strengthening exercises at least twice a week. This is in addition to the moderate-intensity exercise.  Spend less time sitting. Even light physical activity can be beneficial. Watch cholesterol and blood lipids Have your blood tested for lipids and cholesterol at 50 years of age, then have this test every 5 years. You may need to have your cholesterol levels checked more often if:  Your lipid or cholesterol levels are  high.  You are older than 50 years of age.  You are at high risk for heart disease. What should I know about cancer screening? Many types of cancers can be detected early and may often be prevented. Depending on your health history and family history, you may need to have cancer screening at various ages. This may include screening for:  Colorectal cancer.  Prostate cancer.  Skin cancer.  Lung cancer. What should I know about heart disease, diabetes, and high blood pressure? Blood pressure and heart disease  High blood pressure causes heart disease and increases the risk of stroke. This is more likely to develop in people who have high blood pressure readings, are of African descent, or are overweight.  Talk with your health care provider about your target blood pressure readings.  Have your blood pressure checked: ? Every 3-5 years if you are 43-38 years of age. ? Every year if you are 57 years old or older.  If you are between the ages of 52 and 50 and are a current or former smoker, ask your health care provider if you should have a one-time screening for abdominal aortic aneurysm (AAA). Diabetes Have regular diabetes screenings. This checks your fasting blood sugar level. Have the screening done:  Once every three years after age 75 if you are at a normal weight and have a low risk for diabetes.  More often and at a younger age if you are overweight or have a high risk for diabetes. What should I know about preventing infection? Hepatitis  B If you have a higher risk for hepatitis B, you should be screened for this virus. Talk with your health care provider to find out if you are at risk for hepatitis B infection. Hepatitis C Blood testing is recommended for:  Everyone born from [birth year] through 1966.  Anyone with known risk factors for hepatitis C. Sexually transmitted infections (STIs)  You should be screened each year for STIs, including gonorrhea and chlamydia,  if: ? You are sexually active and are younger than 50 years of age. ? You are older than 50 years of age and your health care provider tells you that you are at risk for this type of infection. ? Your sexual activity has changed since you were last screened, and you are at increased risk for chlamydia or gonorrhea. Ask your health care provider if you are at risk.  Ask your health care provider about whether you are at high risk for HIV. Your health care provider may recommend a prescription medicine to help prevent HIV infection. If you choose to take medicine to prevent HIV, you should first get tested for HIV. You should then be tested every 3 months for as long as you are taking the medicine. Follow these instructions at home: Lifestyle  Do not use any products that contain nicotine or tobacco, such as cigarettes, e-cigarettes, and chewing tobacco. If you need help quitting, ask your health care provider.  Do not use street drugs.  Do not share needles.  Ask your health care provider for help if you need support or information about quitting drugs. Alcohol use  Do not drink alcohol if your health care provider tells you not to drink.  If you drink alcohol: ? Limit how much you have to 0-2 drinks a day. ? Be aware of how much alcohol is in your drink. In the [place] one drink equals one 12 oz bottle of beer (355 mL), one 5 oz glass of wine (148 mL), or one 1 oz glass of hard liquor (44 mL). General instructions  Schedule regular health, dental, and eye exams.  Stay current with your vaccines.  Tell your health care provider if: ? You often feel depressed. ? You have ever been abused or do not feel safe at home. Summary  Adopting a healthy lifestyle and getting preventive care are important in promoting health and wellness.  Follow your health care provider's instructions about healthy diet, exercising, and getting tested or screened for diseases.  Follow your health care  provider's instructions on monitoring your cholesterol and blood pressure. This information is not intended to replace advice given to you by your health care provider. Make sure you discuss any questions you have with your health care provider. Document Revised: 09/27/2018 Document Reviewed: 09/27/2018 Elsevier Patient Education  2020 [employer].

## 2022-04-06 ENCOUNTER — Encounter: Payer: 59 | Admitting: Family Medicine

## 2022-05-25 ENCOUNTER — Encounter: Payer: 59 | Admitting: Family Medicine

## 2022-06-21 ENCOUNTER — Other Ambulatory Visit: Payer: Self-pay | Admitting: Family Medicine

## 2022-06-21 DIAGNOSIS — Z1159 Encounter for screening for other viral diseases: Secondary | ICD-10-CM

## 2022-06-21 DIAGNOSIS — E785 Hyperlipidemia, unspecified: Secondary | ICD-10-CM

## 2022-06-23 ENCOUNTER — Other Ambulatory Visit (INDEPENDENT_AMBULATORY_CARE_PROVIDER_SITE_OTHER): Payer: 59

## 2022-06-23 DIAGNOSIS — Z1159 Encounter for screening for other viral diseases: Secondary | ICD-10-CM

## 2022-06-23 DIAGNOSIS — E785 Hyperlipidemia, unspecified: Secondary | ICD-10-CM

## 2022-06-23 LAB — COMPREHENSIVE METABOLIC PANEL
ALT: 41 U/L (ref 0–53)
AST: 30 U/L (ref 0–37)
Albumin: 4.4 g/dL (ref 3.5–5.2)
Alkaline Phosphatase: 65 U/L (ref 39–117)
BUN: 24 mg/dL — ABNORMAL HIGH (ref 6–23)
CO2: 30 mEq/L (ref 19–32)
Calcium: 9.6 mg/dL (ref 8.4–10.5)
Chloride: 104 mEq/L (ref 96–112)
Creatinine, Ser: 1.01 mg/dL (ref 0.40–1.50)
GFR: 86 mL/min (ref >60.00)
Glucose, Bld: 91 mg/dL (ref 70–99)
Potassium: 4.4 mEq/L (ref 3.5–5.1)
Sodium: 141 mEq/L (ref 135–145)
Total Bilirubin: 1.3 mg/dL — ABNORMAL HIGH (ref 0.2–1.2)
Total Protein: 7.5 g/dL (ref 6.0–8.3)

## 2022-06-23 LAB — LIPID PANEL
Cholesterol: 193 mg/dL (ref 0–200)
HDL: 43.1 mg/dL (ref >39.00)
LDL Cholesterol: 115 mg/dL — ABNORMAL HIGH (ref 0–99)
NonHDL: 149.65
Total CHOL/HDL Ratio: 4
Triglycerides: 173 mg/dL — ABNORMAL HIGH (ref 0.0–149.0)
VLDL: 34.6 mg/dL (ref 0.0–40.0)

## 2022-06-24 LAB — HEPATITIS C ANTIBODY: Hepatitis C Ab: NONREACTIVE

## 2022-06-30 ENCOUNTER — Encounter: Payer: Self-pay | Admitting: Family Medicine

## 2022-06-30 ENCOUNTER — Ambulatory Visit (INDEPENDENT_AMBULATORY_CARE_PROVIDER_SITE_OTHER): Payer: 59 | Admitting: Family Medicine

## 2022-06-30 VITALS — BP 122/76 | HR 61 | Temp 97.7°F | Ht 62.5 in | Wt 134.4 lb

## 2022-06-30 DIAGNOSIS — R5383 Other fatigue: Secondary | ICD-10-CM

## 2022-06-30 DIAGNOSIS — E785 Hyperlipidemia, unspecified: Secondary | ICD-10-CM | POA: Diagnosis not present

## 2022-06-30 DIAGNOSIS — Z23 Encounter for immunization: Secondary | ICD-10-CM | POA: Diagnosis not present

## 2022-06-30 DIAGNOSIS — Z0000 Encounter for general adult medical examination without abnormal findings: Secondary | ICD-10-CM

## 2022-06-30 DIAGNOSIS — Z125 Encounter for screening for malignant neoplasm of prostate: Secondary | ICD-10-CM | POA: Diagnosis not present

## 2022-06-30 DIAGNOSIS — R300 Dysuria: Secondary | ICD-10-CM | POA: Diagnosis not present

## 2022-06-30 DIAGNOSIS — Z91018 Allergy to other foods: Secondary | ICD-10-CM

## 2022-06-30 LAB — POC URINALSYSI DIPSTICK (AUTOMATED)
Bilirubin, UA: NEGATIVE
Blood, UA: NEGATIVE
Glucose, UA: NEGATIVE
Ketones, UA: NEGATIVE
Leukocytes, UA: NEGATIVE
Nitrite, UA: NEGATIVE
Protein, UA: NEGATIVE
Spec Grav, UA: 1.025 (ref 1.010–1.025)
Urobilinogen, UA: 0.2 E.U./dL
pH, UA: 6 (ref 5.0–8.0)

## 2022-06-30 LAB — CBC WITH DIFFERENTIAL/PLATELET
Basophils Absolute: 0 10*3/uL (ref 0.0–0.1)
Basophils Relative: 0.4 % (ref 0.0–3.0)
Eosinophils Absolute: 0.1 10*3/uL (ref 0.0–0.7)
Eosinophils Relative: 1.4 % (ref 0.0–5.0)
HCT: 46 % (ref 39.0–52.0)
Hemoglobin: 15.9 g/dL (ref 13.0–17.0)
Lymphocytes Relative: 35.9 % (ref 12.0–46.0)
Lymphs Abs: 1.9 10*3/uL (ref 0.7–4.0)
MCHC: 34.5 g/dL (ref 30.0–36.0)
MCV: 86.5 fl (ref 78.0–100.0)
Monocytes Absolute: 0.3 10*3/uL (ref 0.1–1.0)
Monocytes Relative: 6.2 % (ref 3.0–12.0)
Neutro Abs: 3 10*3/uL (ref 1.4–7.7)
Neutrophils Relative %: 56.1 % (ref 43.0–77.0)
Platelets: 173 10*3/uL (ref 150.0–400.0)
RBC: 5.32 Mil/uL (ref 4.22–5.81)
RDW: 14.7 % (ref 11.5–15.5)
WBC: 5.3 10*3/uL (ref 4.0–10.5)

## 2022-06-30 LAB — VITAMIN D 25 HYDROXY (VIT D DEFICIENCY, FRACTURES): VITD: 26.43 ng/mL — ABNORMAL LOW (ref 30.00–100.00)

## 2022-06-30 LAB — VITAMIN B12: Vitamin B-12: 447 pg/mL (ref 211–911)

## 2022-06-30 LAB — PSA: PSA: 0.53 ng/mL (ref 0.10–4.00)

## 2022-06-30 LAB — TSH: TSH: 1.91 u[IU]/mL (ref 0.35–5.50)

## 2022-06-30 MED ORDER — SCOPOLAMINE 1 MG/3DAYS TD PT72: 1.0000 | MEDICATED_PATCH | TRANSDERMAL | 1 refills | Status: DC

## 2022-06-30 NOTE — Assessment & Plan Note (Addendum)
Notes easy fatigue with working out that started 3-6 months ago, mainly noted when overexerting. Discussed importance of good hydration status.   Denies dyspnea or chest pain or other cardiac symptoms.  Will check labs for reversible causes of fatigue.

## 2022-06-30 NOTE — Assessment & Plan Note (Signed)
Chronic, improving, off medication. The 10-year ASCVD risk score (Arnett DK, et al., 2019) is: 3.9%   Values used to calculate the score:     Age: 51 years     Sex: Male     Is Non-Hispanic African American: No     Diabetic: No     Tobacco smoker: No     Systolic Blood Pressure: 072 mmHg     Is BP treated: No     HDL Cholesterol: 43.1 mg/dL     Total Cholesterol: 193 mg/dL

## 2022-06-30 NOTE — Assessment & Plan Note (Signed)
Preventative protocols reviewed and updated unless pt declined. Discussed healthy diet and lifestyle.  

## 2022-06-30 NOTE — Patient Instructions (Addendum)
Flu shot today Urinalysis today. And labs today.  Send [place] date of COVID booster for [place] to update your chart.  Good to see you today Return as needed or in 1 year for next physical.   Health Maintenance, Male Adopting a healthy lifestyle and getting preventive care are important in promoting health and wellness. Ask your health care provider about: The right schedule for you to have regular tests and exams. Things you can do on your own to prevent diseases and keep yourself healthy. What should I know about diet, weight, and exercise? Eat a healthy diet  Eat a diet that includes plenty of vegetables, fruits, low-fat dairy products, and lean protein. Do not eat a lot of foods that are high in solid fats, added sugars, or sodium. Maintain a healthy weight Body mass index (BMI) is a measurement that can be used to identify possible weight problems. It estimates body fat based on height and weight. Your health care provider can help determine your BMI and help you achieve or maintain a healthy weight. Get regular exercise Get regular exercise. This is one of the most important things you can do for your health. Most adults should: Exercise for at least 150 minutes each week. The exercise should increase your heart rate and make you sweat (moderate-intensity exercise). Do strengthening exercises at least twice a week. This is in addition to the moderate-intensity exercise. Spend less time sitting. Even light physical activity can be beneficial. Watch cholesterol and blood lipids Have your blood tested for lipids and cholesterol at 52 years of age, then have this test every 5 years. You may need to have your cholesterol levels checked more often if: Your lipid or cholesterol levels are high. You are older than 52 years of age. You are at high risk for heart disease. What should I know about cancer screening? Many types of cancers can be detected early and may often be prevented. Depending on  your health history and family history, you may need to have cancer screening at various ages. This may include screening for: Colorectal cancer. Prostate cancer. Skin cancer. Lung cancer. What should I know about heart disease, diabetes, and high blood pressure? Blood pressure and heart disease High blood pressure causes heart disease and increases the risk of stroke. This is more likely to develop in people who have high blood pressure readings or are overweight. Talk with your health care provider about your target blood pressure readings. Have your blood pressure checked: Every 3-5 years if you are 72-63 years of age. Every year if you are 19 years old or older. If you are between the ages of 15 and 46 and are a current or former smoker, ask your health care provider if you should have a one-time screening for abdominal aortic aneurysm (AAA). Diabetes Have regular diabetes screenings. This checks your fasting blood sugar level. Have the screening done: Once every three years after age 63 if you are at a normal weight and have a low risk for diabetes. More often and at a younger age if you are overweight or have a high risk for diabetes. What should I know about preventing infection? Hepatitis B If you have a higher risk for hepatitis B, you should be screened for this virus. Talk with your health care provider to find out if you are at risk for hepatitis B infection. Hepatitis C Blood testing is recommended for: Everyone born from [birth year] through 1965. Anyone with known risk factors for hepatitis C.  Sexually transmitted infections (STIs) You should be screened each year for STIs, including gonorrhea and chlamydia, if: You are sexually active and are younger than 52 years of age. You are older than 52 years of age and your health care provider tells you that you are at risk for this type of infection. Your sexual activity has changed since you were last screened, and you are at increased  risk for chlamydia or gonorrhea. Ask your health care provider if you are at risk. Ask your health care provider about whether you are at high risk for HIV. Your health care provider may recommend a prescription medicine to help prevent HIV infection. If you choose to take medicine to prevent HIV, you should first get tested for HIV. You should then be tested every 3 months for as long as you are taking the medicine. Follow these instructions at home: Alcohol use Do not drink alcohol if your health care provider tells you not to drink. If you drink alcohol: Limit how much you have to 0-2 drinks a day. Know how much alcohol is in your drink. In the [place] one drink equals one 12 oz bottle of beer (355 mL), one 5 oz glass of wine (148 mL), or one 1 oz glass of hard liquor (44 mL). Lifestyle Do not use any products that contain nicotine or tobacco. These products include cigarettes, chewing tobacco, and vaping devices, such as e-cigarettes. If you need help quitting, ask your health care provider. Do not use street drugs. Do not share needles. Ask your health care provider for help if you need support or information about quitting drugs. General instructions Schedule regular health, dental, and eye exams. Stay current with your vaccines. Tell your health care provider if: You often feel depressed. You have ever been abused or do not feel safe at home. Summary Adopting a healthy lifestyle and getting preventive care are important in promoting health and wellness. Follow your health care provider's instructions about healthy diet, exercising, and getting tested or screened for diseases. Follow your health care provider's instructions on monitoring your cholesterol and blood pressure. This information is not intended to replace advice given to you by your health care provider. Make sure you discuss any questions you have with your health care provider. Document Revised: 02/23/2021 Document  Reviewed: 02/23/2021 Elsevier Patient Education  [employer].

## 2022-06-30 NOTE — Assessment & Plan Note (Signed)
Continues avoiding mammalian meat with benefit.

## 2022-06-30 NOTE — Addendum Note (Signed)
Addended by: Brenton Grills on: 2/77/4128 78:67 AM   Modules accepted: Orders

## 2022-06-30 NOTE — Progress Notes (Signed)
Patient ID: Douglas [name], male    DOB: 08-22-1971, 51 y.o.   MRN: [id number]  This visit was conducted in person.  BP 122/76   Pulse 61   Temp 97.7 F (36.5 C) (Temporal)   Ht 5' 2.5" (1.588 m)   Wt 134 lb 6 oz (61 kg)   SpO2 97%   BMI 24.19 kg/m    CC: CPE Subjective:   HPI: Douglas [name] is a 51 y.o. male presenting on 06/30/2022 for Annual Exam   Recent deep [place] fishing trip over weekend. Planned rpt trip. Requests scopolamine patch.    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 Continues avoiding mammalian meat.   Occasional dysuria, frequency and darker urine color when he eats honey.  Notes easy fatigueability after prolonged walking or working out.  No chest pain or dyspnea or cough or dizziness/palpitations, no paresthesias or numbness, no night sweats. Ongoing for 3-6 months.    Preventative: COLONOSCOPY 05/2019 - 2 diminutive TA, diverticulosis, negative biopsies, rpt 7 yrs Carlean Purl) Prostate screening - yearly PSA. No fmhx, denies prostate symptoms.  Lung cancer screening - not eligible  Flu shot yearly Td 2011 - declines today [place] 05/2020, 06/2020, 1 booster  Shingrix vaccine - discussed Seat belt use discussed  Sunscreen use discussed. No changing moles on skin  Sleep - averaging 6 hours/night Non smoker Alcohol - none Dentist q6 mo Eye exam due - no vision problems   Caffeine: 1 cup coffee/day Married, lives with wife and son (adopted), no pets Occupation: Social research officer, government Activity: walking at [employer] park regularly, enjoys tennis Diet: fruits and vegetables daily, good water, avoid mammalian meat     Relevant past medical, surgical, family and social history reviewed and updated as indicated. Interim medical history since our last visit reviewed. Allergies and medications reviewed and updated. Outpatient Medications Prior to Visit  Medication Sig Dispense Refill   EPINEPHrine 0.3 mg/0.3 mL IJ SOAJ injection Inject 0.3 mLs (0.3 mg  total) into the muscle as needed for anaphylaxis (alph-gal allergy). 1 each 0   scopolamine (TRANSDERM-SCOP, 1.5 MG,) 1 MG/3DAYS Place 1 patch (1.5 mg total) onto the skin every 3 (three) days. 4 patch 1   No facility-administered medications prior to visit.     Per HPI unless specifically indicated in ROS section below Review of Systems  Constitutional:  Negative for activity change, appetite change, chills, fatigue, fever and unexpected weight change.  HENT:  Negative for hearing loss.   Eyes:  Negative for visual disturbance.  Respiratory:  Negative for cough, chest tightness, shortness of breath and wheezing.   Cardiovascular:  Negative for chest pain, palpitations and leg swelling.  Gastrointestinal:  Negative for abdominal distention, abdominal pain, blood in stool, constipation, diarrhea, nausea and vomiting.  Genitourinary:  Negative for difficulty urinating and hematuria.  Musculoskeletal:  Negative for arthralgias, myalgias and neck pain.  Skin:  Negative for rash.  Neurological:  Negative for dizziness, seizures, syncope and headaches.  Hematological:  Negative for adenopathy. Does not bruise/bleed easily.  Psychiatric/Behavioral:  Negative for dysphoric mood. The patient is not nervous/anxious.     Objective:  BP 122/76   Pulse 61   Temp 97.7 F (36.5 C) (Temporal)   Ht 5' 2.5" (1.588 m)   Wt 134 lb 6 oz (61 kg)   SpO2 97%   BMI 24.19 kg/m   Wt Readings from Last 3 Encounters:  06/30/22 134 lb 6 oz (61 kg)  08/06/20 133 lb  7 oz (60.5 kg)  08/16/19 131 lb 2 oz (59.5 kg)      Physical Exam Vitals and nursing note reviewed.  Constitutional:      General: He is not in acute distress.    Appearance: Normal appearance. He is well-developed. He is not ill-appearing.  HENT:     Head: Normocephalic and atraumatic.     Right Ear: Hearing, tympanic membrane, ear canal and external ear normal.     Left Ear: Hearing, tympanic membrane, ear canal and external ear normal.   Eyes:     General: No scleral icterus.    Extraocular Movements: Extraocular movements intact.     Conjunctiva/sclera: Conjunctivae normal.     Pupils: Pupils are equal, round, and reactive to light.  Neck:     Thyroid: No thyroid mass or thyromegaly.  Cardiovascular:     Rate and Rhythm: Normal rate and regular rhythm.     Pulses: Normal pulses.          Radial pulses are 2+ on the right side and 2+ on the left side.     Heart sounds: Normal heart sounds. No murmur heard. Pulmonary:     Effort: Pulmonary effort is normal. No respiratory distress.     Breath sounds: Normal breath sounds. No wheezing, rhonchi or rales.  Abdominal:     General: Bowel sounds are normal. There is no distension.     Palpations: Abdomen is soft. There is no mass.     Tenderness: There is no abdominal tenderness. There is no guarding or rebound.     Hernia: No hernia is present.  Musculoskeletal:        General: Normal range of motion.     Cervical back: Normal range of motion and neck supple.     Right lower leg: No edema.     Left lower leg: No edema.  Lymphadenopathy:     Cervical: No cervical adenopathy.  Skin:    General: Skin is warm and dry.     Findings: No rash.  Neurological:     General: No focal deficit present.     Mental Status: He is alert and oriented to person, place, and time.  Psychiatric:        Mood and Affect: Mood normal.        Behavior: Behavior normal.        Thought Content: Thought content normal.        Judgment: Judgment normal.       Results for orders placed or performed in visit on 06/23/22  Hepatitis C antibody  Result Value Ref Range   Hepatitis C Ab NON-REACTIVE NON-REACTIVE  Comprehensive metabolic panel  Result Value Ref Range   Sodium 141 135 - 145 mEq/L   Potassium 4.4 3.5 - 5.1 mEq/L   Chloride 104 96 - 112 mEq/L   CO2 30 19 - 32 mEq/L   Glucose, Bld 91 70 - 99 mg/dL   BUN 24 (H) 6 - 23 mg/dL   Creatinine, Ser 1.01 0.40 - 1.50 mg/dL   Total  Bilirubin 1.3 (H) 0.2 - 1.2 mg/dL   Alkaline Phosphatase 65 39 - 117 U/L   AST 30 0 - 37 U/L   ALT 41 0 - 53 U/L   Total Protein 7.5 6.0 - 8.3 g/dL   Albumin 4.4 3.5 - 5.2 g/dL   GFR 86.00 >60.00 mL/min   Calcium 9.6 8.4 - 10.5 mg/dL  Lipid panel  Result Value Ref Range  Cholesterol 193 0 - 200 mg/dL   Triglycerides 173.0 (H) 0.0 - 149.0 mg/dL   HDL 43.10 >39.00 mg/dL   VLDL 34.6 0.0 - 40.0 mg/dL   LDL Cholesterol 115 (H) 0 - 99 mg/dL   Total CHOL/HDL Ratio 4    NonHDL 149.65    No results found for: "PSA1", "PSA"   Assessment & Plan:   Problem List Items Addressed This Visit     Healthcare maintenance - Primary (Chronic)    Preventative protocols reviewed and updated unless pt declined. Discussed healthy diet and lifestyle.       Dyslipidemia    Chronic, improving, off medication. The 10-year ASCVD risk score (Arnett DK, et al., 2019) is: 3.9%   Values used to calculate the score:     Age: 55 years     Sex: Male     Is Non-Hispanic African American: No     Diabetic: No     Tobacco smoker: No     Systolic Blood Pressure: 287 mmHg     Is BP treated: No     HDL Cholesterol: 43.1 mg/dL     Total Cholesterol: 193 mg/dL       Allergy to alpha-gal    Continues avoiding mammalian meat with benefit.       Fatigue    Notes easy fatigue with working out that started 3-6 months ago, mainly noted when overexerting. Discussed importance of good hydration status.   Denies dyspnea or chest pain or other cardiac symptoms.  Will check labs for reversible causes of fatigue.       Relevant Orders   Vitamin B12   VITAMIN D 25 Hydroxy (Vit-D Deficiency, Fractures)   TSH   CBC with Differential/Platelet   Other Visit Diagnoses     Special screening for malignant neoplasm of prostate       Relevant Orders   PSA   Need for influenza vaccination       Relevant Orders   Flu Vaccine QUAD 1moIM (Fluarix, Fluzone & Alfiuria Quad PF) (Completed)   Dysuria            Meds  ordered this encounter  Medications   scopolamine (TRANSDERM-SCOP, 1.5 MG,) 1 MG/3DAYS    Sig: Place 1 patch (1.5 mg total) onto the skin every 3 (three) days.    Dispense:  4 patch    Refill:  1   Orders Placed This Encounter  Procedures   Flu Vaccine QUAD 618moM (Fluarix, Fluzone & Alfiuria Quad PF)   Vitamin B12   VITAMIN D 25 Hydroxy (Vit-D Deficiency, Fractures)   TSH   CBC with Differential/Platelet   PSA     Patient instructions: Flu shot today Urinalysis today. And labs today.  [place] update your chart.  Good to see you today Return as needed or in 1 year for next physical.    Follow up plan: Return in about 1 year (around 07/01/2023) for annual exam, prior fasting for blood work.  JaRia BushMD

## 2022-07-01 ENCOUNTER — Other Ambulatory Visit: Payer: Self-pay | Admitting: Family Medicine

## 2022-07-01 ENCOUNTER — Encounter: Payer: Self-pay | Admitting: Family Medicine

## 2022-07-01 DIAGNOSIS — E559 Vitamin D deficiency, unspecified: Secondary | ICD-10-CM | POA: Insufficient documentation

## 2022-07-01 MED ORDER — VITAMIN D3 25 MCG (1000 UT) PO CAPS: 1.0000 | ORAL_CAPSULE | Freq: Every day | ORAL | Status: AC

## 2023-06-02 ENCOUNTER — Encounter (INDEPENDENT_AMBULATORY_CARE_PROVIDER_SITE_OTHER): Payer: Self-pay

## 2023-06-23 ENCOUNTER — Other Ambulatory Visit: Payer: Self-pay | Admitting: Family Medicine

## 2023-06-23 DIAGNOSIS — E559 Vitamin D deficiency, unspecified: Secondary | ICD-10-CM

## 2023-06-23 DIAGNOSIS — E785 Hyperlipidemia, unspecified: Secondary | ICD-10-CM

## 2023-06-28 ENCOUNTER — Other Ambulatory Visit (INDEPENDENT_AMBULATORY_CARE_PROVIDER_SITE_OTHER): Payer: No Typology Code available for payment source

## 2023-06-28 DIAGNOSIS — E785 Hyperlipidemia, unspecified: Secondary | ICD-10-CM | POA: Diagnosis not present

## 2023-06-28 DIAGNOSIS — E559 Vitamin D deficiency, unspecified: Secondary | ICD-10-CM | POA: Diagnosis not present

## 2023-06-28 LAB — LIPID PANEL
Cholesterol: 172 mg/dL (ref 0–200)
HDL: 45.2 mg/dL (ref >39.00)
LDL Cholesterol: 73 mg/dL (ref 0–99)
NonHDL: 126.84
Total CHOL/HDL Ratio: 4
Triglycerides: 268 mg/dL — ABNORMAL HIGH (ref 0.0–149.0)
VLDL: 53.6 mg/dL — ABNORMAL HIGH (ref 0.0–40.0)

## 2023-06-28 LAB — COMPREHENSIVE METABOLIC PANEL
ALT: 36 U/L (ref 0–53)
AST: 26 U/L (ref 0–37)
Albumin: 4.3 g/dL (ref 3.5–5.2)
Alkaline Phosphatase: 79 U/L (ref 39–117)
BUN: 17 mg/dL (ref 6–23)
CO2: 30 meq/L (ref 19–32)
Calcium: 9.2 mg/dL (ref 8.4–10.5)
Chloride: 103 meq/L (ref 96–112)
Creatinine, Ser: 0.95 mg/dL (ref 0.40–1.50)
GFR: 91.9 mL/min (ref >60.00)
Glucose, Bld: 94 mg/dL (ref 70–99)
Potassium: 3.9 meq/L (ref 3.5–5.1)
Sodium: 140 meq/L (ref 135–145)
Total Bilirubin: 0.8 mg/dL (ref 0.2–1.2)
Total Protein: 7.3 g/dL (ref 6.0–8.3)

## 2023-06-28 LAB — VITAMIN D 25 HYDROXY (VIT D DEFICIENCY, FRACTURES): VITD: 30.24 ng/mL (ref 30.00–100.00)

## 2023-07-05 ENCOUNTER — Ambulatory Visit (INDEPENDENT_AMBULATORY_CARE_PROVIDER_SITE_OTHER): Payer: No Typology Code available for payment source | Admitting: Family Medicine

## 2023-07-05 ENCOUNTER — Encounter: Payer: Self-pay | Admitting: Family Medicine

## 2023-07-05 VITALS — BP 122/74 | HR 73 | Temp 97.6°F | Ht 63.0 in | Wt 133.0 lb

## 2023-07-05 DIAGNOSIS — Z0000 Encounter for general adult medical examination without abnormal findings: Secondary | ICD-10-CM

## 2023-07-05 DIAGNOSIS — R062 Wheezing: Secondary | ICD-10-CM | POA: Insufficient documentation

## 2023-07-05 DIAGNOSIS — R21 Rash and other nonspecific skin eruption: Secondary | ICD-10-CM

## 2023-07-05 DIAGNOSIS — Z7184 Encounter for health counseling related to travel: Secondary | ICD-10-CM

## 2023-07-05 DIAGNOSIS — R0689 Other abnormalities of breathing: Secondary | ICD-10-CM

## 2023-07-05 DIAGNOSIS — E785 Hyperlipidemia, unspecified: Secondary | ICD-10-CM

## 2023-07-05 DIAGNOSIS — Z23 Encounter for immunization: Secondary | ICD-10-CM | POA: Diagnosis not present

## 2023-07-05 DIAGNOSIS — Z91018 Allergy to other foods: Secondary | ICD-10-CM

## 2023-07-05 DIAGNOSIS — E559 Vitamin D deficiency, unspecified: Secondary | ICD-10-CM

## 2023-07-05 MED ORDER — TYPHOID VACCINE PO CPDR: 1.0000 | DELAYED_RELEASE_CAPSULE | ORAL | 0 refills | Status: DC

## 2023-07-05 MED ORDER — ATOVAQUONE-PROGUANIL HCL 250-100 MG PO TABS: 1.0000 | ORAL_TABLET | Freq: Every day | ORAL | 0 refills | Status: DC

## 2023-07-05 NOTE — Assessment & Plan Note (Signed)
Continue vit D 2000 international units  daily

## 2023-07-05 NOTE — Assessment & Plan Note (Signed)
Preventative protocols reviewed and updated unless pt declined. Discussed healthy diet and lifestyle.

## 2023-07-05 NOTE — Assessment & Plan Note (Signed)
He feels he is tolerating red meats better. Will monitor this, encouraged limiting exposure.

## 2023-07-05 NOTE — Progress Notes (Signed)
Ph: [phone] Fax: [phone]   Patient ID: Douglas [name], male    DOB: [birth year], 52 y.o.   MRN: [id number]  This visit was conducted in person.  BP 122/74   Pulse 73   Temp 97.6 F (36.4 C) (Temporal)   Ht 5\' 3"  (1.6 m)   Wt 133 lb (60.3 kg)   SpO2 98%   BMI 23.56 kg/m    CC: CPE Subjective:   HPI: Douglas [name] is a 52 y.o. male presenting on 07/05/2023 for Annual Exam   Upcoming family trip to [place] in October - will be in both countries for 2 wks each, staying with family. No remote travel planned. He has Rx for ciprofloxacin antibiotic for traveler's diarrhea   Malaria prevention is recommended for all travel to [place], but only to certain areas of [place]: Provinces that border [place], [place] (except [address] [place]), and [place] (except [place]); the provinces of [place], [place], and [place].  Doesn't need malaria prevention during his time in [place]. He only needs to take malaria prevention medicine 2 days prior to travel to [place] and continue through 1 week after he returns home.  Would like typhoid vaccine. I've sent enough to last him 24 days.   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 Continues avoiding mammalian meat. For the last 2 weeks he tried beef/pork without significant symptoms.   Notes scaling to occipital/posterior scalp as well as to bilateral cheeks. Managing with OTC antifungal cream.    Preventative: COLONOSCOPY 05/2019 - 2 diminutive TA, diverticulosis, negative biopsies, rpt 7 yrs Leone Payor) Prostate screening - yearly PSA. No fmhx, denies nocturia, weak stream.  Lung cancer screening - not eligible  Flu shot yearly COVID vaccine Pfizer 05/2020, 06/2020, 1 booster  Td 2011 - due for rpt Shingrix vaccine - discussed Seat belt use discussed  Sunscreen use discussed. No changing moles on skin  Sleep - averaging 6 hours/night  Non smoker Alcohol - none Dentist q6 mo Eye exam due    Caffeine: 1 cup  coffee/day Married, lives with wife and son (adopted), no pets Occupation: Designer, multimedia Activity: walking at [employer] regularly  Diet: fruits and vegetables daily, good water, avoid mammalian meat     Relevant past medical, surgical, family and social history reviewed and updated as indicated. Interim medical history since our last visit reviewed. Allergies and medications reviewed and updated. Outpatient Medications Prior to Visit  Medication Sig Dispense Refill   Cholecalciferol (VITAMIN D3) 25 MCG (1000 UT) CAPS Take 1 capsule (1,000 Units total) by mouth daily. 30 capsule    EPINEPHrine 0.3 mg/0.3 mL IJ SOAJ injection Inject 0.3 mLs (0.3 mg total) into the muscle as needed for anaphylaxis (alph-gal allergy). 1 each 0   scopolamine (TRANSDERM-SCOP, 1.5 MG,) 1 MG/3DAYS Place 1 patch (1.5 mg total) onto the skin every 3 (three) days. (Patient not taking: Reported on 07/05/2023) 4 patch 1   No facility-administered medications prior to visit.     Per HPI unless specifically indicated in ROS section below Review of Systems  Constitutional:  Negative for activity change, appetite change, chills, fatigue, fever and unexpected weight change.  HENT:  Negative for hearing loss.   Eyes:  Negative for visual disturbance.  Respiratory:  Negative for cough, chest tightness, shortness of breath and wheezing.   Cardiovascular:  Negative for chest pain, palpitations and leg swelling.  Gastrointestinal:  Negative for abdominal distention, abdominal pain, blood in stool, constipation, diarrhea, nausea and  vomiting.  Genitourinary:  Negative for difficulty urinating and hematuria.  Musculoskeletal:  Negative for arthralgias, myalgias and neck pain.  Skin:  Negative for rash.  Neurological:  Negative for dizziness, seizures, syncope and headaches.  Hematological:  Negative for adenopathy. Does not bruise/bleed easily.  Psychiatric/Behavioral:  Negative for dysphoric mood. The patient is not  nervous/anxious.     Objective:  BP 122/74   Pulse 73   Temp 97.6 F (36.4 C) (Temporal)   Ht 5\' 3"  (1.6 m)   Wt 133 lb (60.3 kg)   SpO2 98%   BMI 23.56 kg/m   Wt Readings from Last 3 Encounters:  07/05/23 133 lb (60.3 kg)  06/30/22 134 lb 6 oz (61 kg)  08/06/20 133 lb 7 oz (60.5 kg)      Physical Exam Vitals and nursing note reviewed.  Constitutional:      General: He is not in acute distress.    Appearance: Normal appearance. He is well-developed. He is not ill-appearing.  HENT:     Head: Normocephalic and atraumatic.     Right Ear: Hearing, tympanic membrane, ear canal and external ear normal.     Left Ear: Hearing, tympanic membrane, ear canal and external ear normal.     Nose: Nose normal.     Mouth/Throat:     Mouth: Mucous membranes are moist.     Pharynx: Oropharynx is clear. No oropharyngeal exudate or posterior oropharyngeal erythema.  Eyes:     General: No scleral icterus.    Extraocular Movements: Extraocular movements intact.     Conjunctiva/sclera: Conjunctivae normal.     Pupils: Pupils are equal, round, and reactive to light.  Neck:     Thyroid: No thyroid mass or thyromegaly.  Cardiovascular:     Rate and Rhythm: Normal rate and regular rhythm.     Pulses: Normal pulses.          Radial pulses are 2+ on the right side and 2+ on the left side.     Heart sounds: Normal heart sounds. No murmur heard. Pulmonary:     Effort: Pulmonary effort is normal. No respiratory distress.     Breath sounds: Rales (LLL) present. No wheezing or rhonchi.  Abdominal:     General: Bowel sounds are normal. There is no distension.     Palpations: Abdomen is soft. There is no mass.     Tenderness: There is abdominal tenderness (chronic mild) in the left lower quadrant. There is no right CVA tenderness, left CVA tenderness, guarding or rebound. Negative signs include Murphy's sign.     Hernia: No hernia is present.  Musculoskeletal:        General: Normal range of motion.      Cervical back: Normal range of motion and neck supple.     Right lower leg: No edema.     Left lower leg: No edema.  Lymphadenopathy:     Cervical: No cervical adenopathy.  Skin:    General: Skin is warm and dry.     Findings: Rash (dry patch to left lower back) present.          Comments: Dry itchy patch to right lower scalp   Neurological:     General: No focal deficit present.     Mental Status: He is alert and oriented to person, place, and time.  Psychiatric:        Mood and Affect: Mood normal.        Behavior: Behavior normal.  Thought Content: Thought content normal.        Judgment: Judgment normal.       Results for orders placed or performed in visit on 06/28/23  VITAMIN D 25 Hydroxy (Vit-D Deficiency, Fractures)  Result Value Ref Range   VITD 30.24 30.00 - 100.00 ng/mL  Comprehensive metabolic panel  Result Value Ref Range   Sodium 140 135 - 145 mEq/L   Potassium 3.9 3.5 - 5.1 mEq/L   Chloride 103 96 - 112 mEq/L   CO2 30 19 - 32 mEq/L   Glucose, Bld 94 70 - 99 mg/dL   BUN 17 6 - 23 mg/dL   Creatinine, Ser 4.09 0.40 - 1.50 mg/dL   Total Bilirubin 0.8 0.2 - 1.2 mg/dL   Alkaline Phosphatase 79 39 - 117 U/L   AST 26 0 - 37 U/L   ALT 36 0 - 53 U/L   Total Protein 7.3 6.0 - 8.3 g/dL   Albumin 4.3 3.5 - 5.2 g/dL   GFR 81.19 >14.78 mL/min   Calcium 9.2 8.4 - 10.5 mg/dL  Lipid panel  Result Value Ref Range   Cholesterol 172 0 - 200 mg/dL   Triglycerides 295.6 (H) 0.0 - 149.0 mg/dL   HDL 21.30 >86.57 mg/dL   VLDL 84.6 (H) 0.0 - 96.2 mg/dL   LDL Cholesterol 73 0 - 99 mg/dL   Total CHOL/HDL Ratio 4    NonHDL 126.84     Assessment & Plan:   Problem List Items Addressed This Visit     Health maintenance examination - Primary (Chronic)    Preventative protocols reviewed and updated unless pt declined. Discussed healthy diet and lifestyle.       Dyslipidemia    Chronic. Reviewed diet choices to maintain triglyceride control.  The 10-year ASCVD  risk score (Arnett DK, et al., 2019) is: 3.5%   Values used to calculate the score:     Age: 26 years     Sex: Male     Is Non-Hispanic African American: No     Diabetic: No     Tobacco smoker: No     Systolic Blood Pressure: 122 mmHg     Is BP treated: No     HDL Cholesterol: 45.2 mg/dL     Total Cholesterol: 172 mg/dL       Allergy to alpha-gal    He feels he is tolerating red meats better. Will monitor this, encouraged limiting exposure.      Vitamin D deficiency    Continue vit D 2000 international units  daily      Skin rash    2 separate rashes - to right posterior scalp ?irritated SK, as well as to left lower back -?fungal. Trial selsun blue shampoo OTC, update if not improving with treatment.       Travel advice encounter    Discussed upcoming [place] travel. Update flu and Tdap today.  Rx vivotif live oral typhoid vaccine. Rx malarone malaria prevention medication to start prior to [place] travel, continue for 1 wk after returns to states. Discussed water safety measures and mosquito prevention.       Abnormal breath sounds    LLL rales, diminished breath sounds. Pt asxs. Check baseline CXR today.       Relevant Orders   DG Chest 2 View   Other Visit Diagnoses     Encounter for immunization       Relevant Orders   Flu vaccine trivalent PF, 6mos and older(Flulaval,Afluria,Fluarix,Fluzone) (Completed)  Encounter for immunization       Relevant Orders   Tdap vaccine greater than or equal to 7yo IM (Completed)        Meds ordered this encounter  Medications   atovaquone-proguanil (MALARONE) 250-100 MG TABS tablet    Sig: Take 1 tablet by mouth daily. Start two days prior to travel to [place], continue through 1 week after returning home    Dispense:  24 tablet    Refill:  0   typhoid (VIVOTIF) DR capsule    Sig: Take 1 capsule by mouth every other day. For total 4 doses    Dispense:  4 capsule    Refill:  0    Orders Placed This Encounter   Procedures   DG Chest 2 View    Standing Status:   Future    Standing Expiration Date:   07/04/2024    Order Specific Question:   Reason for Exam (SYMPTOM  OR DIAGNOSIS REQUIRED)    Answer:   LLL rales, diminished breath sounds    Order Specific Question:   Preferred imaging location?    Answer:   [facility]   Flu vaccine trivalent PF, 6mos and older(Flulaval,Afluria,Fluarix,Fluzone)   Tdap vaccine greater than or equal to 7yo IM    Patient Instructions  Flu shot today  Tdap today.  Chest xray today  I will send malaria prevention medicine to your pharmacy - take 2 days prior to travel to [place], and continue for 1 week after you return to the [place].  Consider shingrix vaccine series when you return from travel.  You are doing well today Return as needed or in 1 year for next physical.   Follow up plan: Return in about 1 year (around 07/04/2024) for annual exam, prior fasting for blood work.  Eustaquio Boyden, MD

## 2023-07-05 NOTE — Assessment & Plan Note (Signed)
Discussed upcoming [place] travel. Update flu and Tdap today.  Rx vivotif live oral typhoid vaccine. Rx malarone malaria prevention medication to start prior to [place] travel, continue for 1 wk after returns to states. Discussed water safety measures and mosquito prevention.

## 2023-07-05 NOTE — Assessment & Plan Note (Signed)
2 separate rashes - to right posterior scalp ?irritated SK, as well as to left lower back -?fungal. Trial selsun blue shampoo OTC, update if not improving with treatment.

## 2023-07-05 NOTE — Patient Instructions (Addendum)
Flu shot today  Tdap today.  Chest xray today  I will send malaria prevention medicine to your pharmacy - take 2 days prior to travel to [place], and continue for 1 week after you return to the [place].  Consider shingrix vaccine series when you return from travel.  You are doing well today Return as needed or in 1 year for next physical.

## 2023-07-05 NOTE — Assessment & Plan Note (Signed)
Chronic. Reviewed diet choices to maintain triglyceride control.  The 10-year ASCVD risk score (Arnett DK, et al., 2019) is: 3.5%   Values used to calculate the score:     Age: 52 years     Sex: Male     Is Non-Hispanic African American: No     Diabetic: No     Tobacco smoker: No     Systolic Blood Pressure: 122 mmHg     Is BP treated: No     HDL Cholesterol: 45.2 mg/dL     Total Cholesterol: 172 mg/dL

## 2023-07-05 NOTE — Assessment & Plan Note (Addendum)
LLL rales, diminished breath sounds. Pt asxs. Check baseline CXR today.

## 2023-12-19 ENCOUNTER — Ambulatory Visit: Payer: Self-pay | Admitting: Family Medicine

## 2023-12-19 NOTE — Telephone Encounter (Signed)
 Noted. Will see on Wed.

## 2023-12-19 NOTE — Telephone Encounter (Signed)
 Copied from CRM [id number]. Topic: Clinical - Red Word Triage >> Dec 19, 2023  9:31 AM [place] wrote: Red Word that prompted transfer to Nurse Triage: Patient having shortness of breath going on for a couple days now.   Chief Complaint: Difficulty breathing  Symptoms: Wheezing, cough  Frequency: Intermittent  Pertinent Negatives: Patient denies fever, chest pain Disposition: [] ED /[] Urgent Care (no appt availability in office) / [x] Appointment(In office/virtual)/ []  [facility]/ [] Home Care/ [] Refused Recommended Disposition /[] [facility]/ []  Follow-up with PCP Additional Notes: Patient reports that for the last couple of months he has been experiencing wheezing and a cough when laying down for bed. He states that his symptoms are only present at night. He denies any other complaint at this time. Appointment made for the patient for evaluation in the office.     Reason for Disposition  [1] MILD longstanding difficulty breathing AND [2]  SAME as normal    2-3 months, no changes per patient  Answer Assessment - Initial Assessment Questions 1. RESPIRATORY STATUS: "Describe your breathing?" (e.g., wheezing, shortness of breath, unable to speak, severe coughing)      No difficulty breathing now 2. ONSET: "When did this breathing problem begin?"      A couple of months  3. PATTERN "Does the difficult breathing come and go, or has it been constant since it started?"      Intermittent at night   4. SEVERITY: "How bad is your breathing?" (e.g., mild, moderate, severe)    - MILD: No SOB at rest, mild SOB with walking, speaks normally in sentences, can lie down, no retractions, pulse < 100.    - MODERATE: SOB at rest, SOB with minimal exertion and prefers to sit, cannot lie down flat, speaks in phrases, mild retractions, audible wheezing, pulse 100-120.    - SEVERE: Very SOB at rest, speaks in single words, struggling to breathe, sitting hunched forward, retractions, pulse >  120      Mild to moderate  5. RECURRENT SYMPTOM: "Have you had difficulty breathing before?" If Yes, ask: "When was the last time?" and "What happened that time?"      No 6. CARDIAC HISTORY: "Do you have any history of heart disease?" (e.g., heart attack, angina, bypass surgery, angioplasty)      No 7. LUNG HISTORY: "Do you have any history of lung disease?"  (e.g., pulmonary embolus, asthma, emphysema)     No 8. CAUSE: "What do you think is causing the breathing problem?"      Unsure  9. OTHER SYMPTOMS: "Do you have any other symptoms? (e.g., dizziness, runny nose, cough, chest pain, fever)     Cough at night 10. O2 SATURATION MONITOR:  "Do you use an oxygen saturation monitor (pulse oximeter) at home?" If Yes, ask: "What is your reading (oxygen level) today?" "What is your usual oxygen saturation reading?" (e.g., 95%)       No  Protocols used: Breathing Difficulty-A-AH

## 2023-12-21 ENCOUNTER — Ambulatory Visit: Admitting: Family Medicine

## 2023-12-21 ENCOUNTER — Encounter: Payer: Self-pay | Admitting: Family Medicine

## 2023-12-21 ENCOUNTER — Ambulatory Visit
Admission: RE | Admit: 2023-12-21 | Discharge: 2023-12-21 | Disposition: A | Discharge status: Home / Self Care | Source: Ambulatory Visit | Attending: Family Medicine | Admitting: Family Medicine

## 2023-12-21 VITALS — BP 114/80 | HR 72 | Temp 98.0°F | Ht 63.0 in | Wt 140.2 lb

## 2023-12-21 DIAGNOSIS — R0689 Other abnormalities of breathing: Secondary | ICD-10-CM | POA: Diagnosis not present

## 2023-12-21 DIAGNOSIS — R062 Wheezing: Secondary | ICD-10-CM

## 2023-12-21 MED ORDER — ALBUTEROL SULFATE HFA 108 (90 BASE) MCG/ACT IN AERS: 2.0000 | INHALATION_SPRAY | Freq: Four times a day (QID) | RESPIRATORY_TRACT | 1 refills | Status: AC | PRN

## 2023-12-21 NOTE — Patient Instructions (Addendum)
 Chest xray today - we will await radiology report Peak flow measured today.  Try guaifenesin over the counter (plain mucinex) with glass of water to help break up mucous.  May try albuterol inhaler as needed for night time wheezing.  Let me know if no better with above for further evaluation.

## 2023-12-21 NOTE — Progress Notes (Signed)
 Ph: [phone] Fax: [phone]   Patient ID: Douglas [name], male    DOB: [birth year], 53 y.o.   MRN: [id number]  This visit was conducted in person.  BP 114/80   Pulse 72   Temp 98 F (36.7 C) (Oral)   Ht 5\' 3"  (1.6 m)   Wt 140 lb 4 oz (63.6 kg)   SpO2 96%   PF 530 L/min Comment: 1) 480, 2) 500, 3) 530  BMI 24.84 kg/m    PF expected: ~500 L/min PF today: 530 (106% expected)  CC: dyspnea Subjective:   HPI: Douglas [name] is a 53 y.o. male presenting on 12/21/2023 for Shortness of Breath (C/o SOB and wheezing- especially at night. Denies any other sxs. Sxs started about 1 mo ago. )   Wife diagnosed with Guillain Barre syndrome - slow recovery but she is improving.   1 mo h/o chest tightness when supine at night, worse on the left, as well as this past week noted wheezing when he sleeps. Does better when he sleeps on right. Also this week noted productive cough at night time of thick tan colored sputum. Notes exertional dyspnea present for the past 4-5 months. Notes this at onset of activity, may get better with prolonged activity.   Notes chest tightness in cold weather.   No fevers/chills, night sweats, ear or tooth pain, head congestion, abd pain, dizziness, leg swelling. No unexpected weight loss, diaphoresis.   No h/o asthma.  Denies h/o previous pneumonia or other lung infection.  He did have mild COVID infection remotely.  No new vitamins, supplements, medications No new foods. No known exposure to metal dust, silica or other known environmental substance.  No h/o TB exposure.  No known modl exposure in house. No smoking history No fmhx lung cancer or breathing issues.  He has air purifier in his room. 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      Relevant past medical, surgical, family and social history reviewed and updated as indicated. Interim medical history since our last visit reviewed. Allergies and  medications reviewed and updated. Outpatient Medications Prior to Visit  Medication Sig Dispense Refill   Cholecalciferol (VITAMIN D3) 25 MCG (1000 UT) CAPS Take 1 capsule (1,000 Units total) by mouth daily. 30 capsule    EPINEPHrine 0.3 mg/0.3 mL IJ SOAJ injection Inject 0.3 mLs (0.3 mg total) into the muscle as needed for anaphylaxis (alph-gal allergy). 1 each 0   atovaquone-proguanil (MALARONE) 250-100 MG TABS tablet Take 1 tablet by mouth daily. Start two days prior to travel to [place], continue through 1 week after returning home 24 tablet 0   typhoid (VIVOTIF) DR capsule Take 1 capsule by mouth every other day. For total 4 doses 4 capsule 0   No facility-administered medications prior to visit.     Per HPI unless specifically indicated in ROS section below Review of Systems  Objective:  BP 114/80   Pulse 72   Temp 98 F (36.7 C) (Oral)   Ht 5\' 3"  (1.6 m)   Wt 140 lb 4 oz (63.6 kg)   SpO2 96%   PF 530 L/min Comment: 1) 480, 2) 500, 3) 530  BMI 24.84 kg/m   Wt Readings from Last 3 Encounters:  12/21/23 140 lb 4 oz (63.6 kg)  07/05/23 133 lb (60.3 kg)  06/30/22 134 lb 6 oz (61 kg)      Physical Exam Vitals and nursing note reviewed.  Constitutional:  Appearance: Normal appearance. He is not ill-appearing.  HENT:     Head: Normocephalic and atraumatic.     Nose: Nose normal. No congestion or rhinorrhea.     Mouth/Throat:     Mouth: Mucous membranes are moist.     Pharynx: Oropharynx is clear. No oropharyngeal exudate or posterior oropharyngeal erythema.  Eyes:     Extraocular Movements: Extraocular movements intact.     Pupils: Pupils are equal, round, and reactive to light.  Neck:     Thyroid: No thyroid mass or thyromegaly.  Cardiovascular:     Rate and Rhythm: Normal rate and regular rhythm.     Pulses: Normal pulses.     Heart sounds: Normal heart sounds. No murmur heard. Pulmonary:     Effort: Pulmonary effort is normal. No respiratory distress.     Breath  sounds: No wheezing, rhonchi or rales.     Comments: Persistence of LLL crackles after deep cough/ breathing Musculoskeletal:     Cervical back: Normal range of motion and neck supple.  Skin:    General: Skin is warm and dry.     Findings: No rash.  Neurological:     Mental Status: He is alert.  Psychiatric:        Mood and Affect: Mood normal.        Behavior: Behavior normal.       Results for orders placed or performed in visit on 06/28/23  VITAMIN D 25 Hydroxy (Vit-D Deficiency, Fractures)   Collection Time: 06/28/23  7:53 AM  Result Value Ref Range   VITD 30.24 30.00 - 100.00 ng/mL  Comprehensive metabolic panel   Collection Time: 06/28/23  7:53 AM  Result Value Ref Range   Sodium 140 135 - 145 mEq/L   Potassium 3.9 3.5 - 5.1 mEq/L   Chloride 103 96 - 112 mEq/L   CO2 30 19 - 32 mEq/L   Glucose, Bld 94 70 - 99 mg/dL   BUN 17 6 - 23 mg/dL   Creatinine, Ser 0.45 0.40 - 1.50 mg/dL   Total Bilirubin 0.8 0.2 - 1.2 mg/dL   Alkaline Phosphatase 79 39 - 117 U/L   AST 26 0 - 37 U/L   ALT 36 0 - 53 U/L   Total Protein 7.3 6.0 - 8.3 g/dL   Albumin 4.3 3.5 - 5.2 g/dL   GFR 40.98 >11.91 mL/min   Calcium 9.2 8.4 - 10.5 mg/dL  Lipid panel   Collection Time: 06/28/23  7:53 AM  Result Value Ref Range   Cholesterol 172 0 - 200 mg/dL   Triglycerides 478.2 (H) 0.0 - 149.0 mg/dL   HDL 95.62 >13.08 mg/dL   VLDL 65.7 (H) 0.0 - 84.6 mg/dL   LDL Cholesterol 73 0 - 99 mg/dL   Total CHOL/HDL Ratio 4    NonHDL 126.84     Assessment & Plan:   Problem List Items Addressed This Visit     Wheezing - Primary   Persistence of LLL crackles without obvious wheezing, otherwise normal air movement.  Suspect component of scarring although he denies h/o pneumonia or other lung infection.  He did have COVID infection remotely - ?scarring from this.  No known environmental exposure.  Check baseline CXR today - no effusion or obvious consolidation, will await radiology read.  Peak flow today -  great (106% expected).  Trial mucinex and albuterol inhaler for symptoms.  Update if ongoing or worsening, consider formal spirometry vs further lung imaging.  Meds ordered this encounter  Medications   albuterol (VENTOLIN HFA) 108 (90 Base) MCG/ACT inhaler    Sig: Inhale 2 puffs into the lungs every 6 (six) hours as needed for wheezing or shortness of breath.    Dispense:  8 g    Refill:  1    No orders of the defined types were placed in this encounter.   Patient Instructions  Chest xray today - we will await radiology report Peak flow measured today.  Try guaifenesin over the counter (plain mucinex) with glass of water to help break up mucous.  May try albuterol inhaler as needed for night time wheezing.  Let me know if no better with above for further evaluation.   Follow up plan: No follow-ups on file.  Eustaquio Boyden, MD

## 2023-12-21 NOTE — Assessment & Plan Note (Addendum)
 Persistence of LLL crackles without obvious wheezing, otherwise normal air movement.  Suspect component of scarring although he denies h/o pneumonia or other lung infection.  He did have COVID infection remotely - ?scarring from this.  No known environmental exposure.  Check baseline CXR today - no effusion or obvious consolidation, will await radiology read.  Peak flow today - great (106% expected).  Trial mucinex and albuterol inhaler for symptoms.  Update if ongoing or worsening, consider formal spirometry vs further lung imaging.

## 2024-01-10 ENCOUNTER — Encounter: Payer: Self-pay | Admitting: Family Medicine

## 2024-11-23 ENCOUNTER — Emergency Department
Admission: EM | Admit: 2024-11-23 | Discharge: 2024-11-23 | Disposition: A | Discharge status: Home / Self Care | Source: Home / Self Care

## 2024-11-23 ENCOUNTER — Emergency Department

## 2024-11-23 ENCOUNTER — Other Ambulatory Visit: Payer: Self-pay

## 2024-11-23 DIAGNOSIS — R040 Epistaxis: Secondary | ICD-10-CM

## 2024-11-23 LAB — APTT: aPTT: 30 s (ref 24–36)

## 2024-11-23 LAB — COMPREHENSIVE METABOLIC PANEL WITH GFR
ALT: 65 U/L — ABNORMAL HIGH (ref 0–44)
AST: 36 U/L (ref 15–41)
Albumin: 4.4 g/dL (ref 3.5–5.0)
Alkaline Phosphatase: 84 U/L (ref 38–126)
Anion gap: 8 (ref 5–15)
BUN: 22 mg/dL — ABNORMAL HIGH (ref 6–20)
CO2: 28 mmol/L (ref 22–32)
Calcium: 9 mg/dL (ref 8.9–10.3)
Chloride: 105 mmol/L (ref 98–111)
Creatinine, Ser: 0.89 mg/dL (ref 0.61–1.24)
GFR, Estimated: >60 mL/min
Glucose, Bld: 115 mg/dL — ABNORMAL HIGH (ref 70–99)
Potassium: 4 mmol/L (ref 3.5–5.1)
Sodium: 142 mmol/L (ref 135–145)
Total Bilirubin: 0.7 mg/dL (ref 0.0–1.2)
Total Protein: 7.2 g/dL (ref 6.5–8.1)

## 2024-11-23 LAB — CBC
HCT: 46.4 % (ref 39.0–52.0)
Hemoglobin: 15.8 g/dL (ref 13.0–17.0)
MCH: 29.6 pg (ref 26.0–34.0)
MCHC: 34.1 g/dL (ref 30.0–36.0)
MCV: 86.9 fL (ref 80.0–100.0)
Platelets: 189 10*3/uL (ref 150–400)
RBC: 5.34 MIL/uL (ref 4.22–5.81)
RDW: 13.7 % (ref 11.5–15.5)
WBC: 6.1 10*3/uL (ref 4.0–10.5)
nRBC: 0 % (ref 0.0–0.2)

## 2024-11-23 LAB — PROTIME-INR
INR: 0.9 (ref 0.8–1.2)
Prothrombin Time: 13.2 s (ref 11.4–15.2)

## 2024-11-23 NOTE — Discharge Instructions (Signed)
 If you have a recurrence of bleeding please lean forward and hold pressure on your nose for 10 to 15 minutes.  If you are unable to stop the bleeding please return to the emergency department.

## 2024-11-23 NOTE — ED Provider Notes (Signed)
 "  [facility] Provider Note    Event Date/Time   First MD Initiated Contact with Patient 11/23/24 [phone]     (approximate)   History   Hemoptysis and Cough   HPI  Douglas [name] is a 54 y.o. male with a past medical history of liver hemangioma, dyslipidemia, alpha gal allergy, presenting to the emergency department complaining of an episode of hemoptysis this morning.  Patient reports that he has had a nonproductive cough for the last month which has seemed to be resolving.  However when he picked up this morning he began coughing up blood.  He states that some of the blood seem to be coming from his nose and was draining down the back of his throat.     Physical Exam   Triage Vital Signs: ED Triage Vitals [11/23/24 0734]  Encounter Vitals Group     BP (!) 129/101     Girls Systolic BP Percentile      Girls Diastolic BP Percentile      Boys Systolic BP Percentile      Boys Diastolic BP Percentile      Pulse Rate 91     Resp 18     Temp 97.7 F (36.5 C)     Temp src      SpO2 95 %     Weight 140 lb 3.4 oz (63.6 kg)     Height 5' 3 (1.6 m)     Head Circumference      Peak Flow      Pain Score 0     Pain Loc      Pain Education      Exclude from Growth Chart     Most recent vital signs: Vitals:   11/23/24 0734  BP: (!) 129/101  Pulse: 91  Resp: 18  Temp: 97.7 F (36.5 C)  SpO2: 95%     General: Awake, no distress.  CV:  Good peripheral perfusion.  Resp:  Normal effort.  Lung sounds clear to auscultation Abd:  No distention.  Other:  Dried blood present in left nare, small amount of blood present in the posterior oropharynx   ED Results / Procedures / Treatments   Labs (all labs ordered are listed, but only abnormal results are displayed) Labs Reviewed  CBC     EKG     RADIOLOGY I, [name], personally viewed and evaluated these images (plain radiographs) as part of my medical decision making, as well as reviewing  the written report by the radiologist.  Chest x-ray does not show any acute pathology    PROCEDURES:  Critical Care performed: No  Procedures   MEDICATIONS ORDERED IN ED: Medications - No data to display   IMPRESSION / MDM / ASSESSMENT AND PLAN / ED COURSE  I reviewed the triage vital signs and the nursing notes.                              Differential diagnosis includes, but is not limited to, epistaxis, bronchitis, pulmonary embolism, pneumonia  Patient's presentation is most consistent with acute presentation with potential threat to life or bodily function.  Patient is a 54 year old male with a past medical history of liver hemangioma, dyslipidemia, alpha gal allergy, presenting to the emergency department complaining of an episode of hemoptysis this morning.  On exam the patient has some dried blood present in his left nostril as well and some to  the posterior oropharynx.  I believe the patient likely has a nosebleed which caused him to cough blood.  Discussed that we perform a chest x-ray and basic lab work for further evaluation as he had never had an episode like this before.  Patient's workup is unremarkable.  Blood work is within normal limits.  Chest x-ray does not show any acute pathology.  On reevaluation patient has not had no recurrence of bleeding.  He still has dried blood present in the left nostril but I do not appreciate any blood into the oropharynx.  He states that he does not feel as if there is any bleeding continuing.  Discussed plan for discharge with instructions to follow-up with his primary care physician.  Discussed that if he has a recurrence of bleeding he has to lean forward and apply direct pressure to his nose for 10 to 15 minutes.  If he is unsuccessful in stopping the bleeding he should return immediately to the emergency department.  Patient voices understanding.      FINAL CLINICAL IMPRESSION(S) / ED DIAGNOSES   Final diagnoses:   Epistaxis     Rx / DC Orders   ED Discharge Orders     None        Note:  This document was prepared using Dragon voice recognition software and may include unintentional dictation errors.   [name], MD 11/23/24 [phone]  "

## 2024-11-23 NOTE — ED Triage Notes (Signed)
 PT states he woke up  coughing up a lot of blood. Denies any pain. Has had dry non productive cough x 1 month. Pt states he had some blood coming from nose and was draining to back of throat.

## 2024-11-27 ENCOUNTER — Inpatient Hospital Stay: Admitting: Family Medicine
